# Patient Record
Sex: Female | Born: 1970
Health system: Southern US, Community
[De-identification: ages and names within clinical notes are randomized; demographics above are authoritative.]

## PROBLEM LIST (undated history)

## (undated) DIAGNOSIS — K649 Unspecified hemorrhoids: Secondary | ICD-10-CM

## (undated) HISTORY — PX: APPENDECTOMY: SHX54

## (undated) HISTORY — PX: TUBAL LIGATION: SHX77

---

## 1999-08-13 ENCOUNTER — Emergency Department (HOSPITAL_COMMUNITY): Admission: EM | Admit: 1999-08-13 | Discharge: 1999-08-13 | Payer: Self-pay | Admitting: Emergency Medicine

## 2005-06-21 ENCOUNTER — Emergency Department (HOSPITAL_COMMUNITY): Admission: EM | Admit: 2005-06-21 | Discharge: 2005-06-21 | Payer: Self-pay | Admitting: Emergency Medicine

## 2006-04-11 ENCOUNTER — Emergency Department (HOSPITAL_COMMUNITY): Admission: EM | Admit: 2006-04-11 | Discharge: 2006-04-11 | Payer: Self-pay | Admitting: Emergency Medicine

## 2012-02-09 ENCOUNTER — Encounter (HOSPITAL_COMMUNITY): Payer: Self-pay | Admitting: *Deleted

## 2012-02-09 ENCOUNTER — Emergency Department (HOSPITAL_COMMUNITY)
Admission: EM | Admit: 2012-02-09 | Discharge: 2012-02-09 | Disposition: A | Discharge status: Home / Self Care | Payer: Self-pay | Attending: Emergency Medicine | Admitting: Emergency Medicine

## 2012-02-09 DIAGNOSIS — B029 Zoster without complications: Secondary | ICD-10-CM | POA: Insufficient documentation

## 2012-02-09 MED ORDER — FLUORESCEIN SODIUM 1 MG OP STRP
1.0000 | ORAL_STRIP | Freq: Once | OPHTHALMIC | Status: AC
  Administered 2012-02-09: 1 via OPHTHALMIC

## 2012-02-09 MED ORDER — PREDNISONE 20 MG PO TABS: ORAL_TABLET | ORAL | Status: DC

## 2012-02-09 MED ORDER — ACYCLOVIR 800 MG PO TABS
800.0000 mg | ORAL_TABLET | Freq: Once | ORAL | Status: AC
  Administered 2012-02-09: 800 mg via ORAL
  Filled 2012-02-09: qty 1

## 2012-02-09 MED ORDER — ACYCLOVIR 800 MG PO TABS: 400.0000 mg | ORAL_TABLET | Freq: Every day | ORAL | Status: DC

## 2012-02-09 MED ORDER — PREDNISONE 10 MG PO TABS: ORAL_TABLET | ORAL | Status: DC

## 2012-02-09 MED ORDER — PREDNISONE 50 MG PO TABS
60.0000 mg | ORAL_TABLET | Freq: Once | ORAL | Status: AC
  Administered 2012-02-09: 60 mg via ORAL
  Filled 2012-02-09: qty 1

## 2012-02-09 MED ORDER — PREDNISONE 20 MG PO TABS
ORAL_TABLET | ORAL | Status: AC
  Filled 2012-02-09: qty 3

## 2012-02-09 NOTE — ED Notes (Signed)
Raised red rash to lt side of face. Present 4 days.  Extending into scalp

## 2012-02-09 NOTE — ED Provider Notes (Signed)
History   This chart was scribed for Ward Givens, MD scribed by Magnus Sinning. The patient was seen in room APA01/APA01 at 22:13    CSN: [id number]  Arrival date & time 02/09/12  2145   Chief Complaint  Patient presents with  . Rash    (Consider location/radiation/quality/duration/timing/severity/associated sxs/prior treatment) HPI Pamela Johnson is a 41 y.o. female who presents to the Emergency Department complaining of raised red rash located on the left side of face and scalp, onset three days. She says it began as a small area with one bump that she could feel. She says it is not painful or itchy. She adds that her left eye has been itching at the lateral corner, which she describes as itching similar to pink eye. She says she noticed she had redness to the left side of her nose yesterday and she says the redness has also extended to her scalp. She has normal vision and describes minimal feeling of wetness to the lateral aspect of her left eye.   Patient says she is otherwise healthy and does not take any daily medications. She does report her daughter had hx of chicken pox years ago and says during this time she did not experience any sxs. She is not sure if she had chicken pox or not.  Pt denies any extra stressors.   PCP: None  History reviewed. No pertinent past medical history.  Past Surgical History  Procedure Date  . Appendectomy   . Tubal ligation     History reviewed. No pertinent family history.  History  Substance Use Topics  . Smoking status: Never Smoker   . Smokeless tobacco: Not on file  . Alcohol Use: No  The patient does not drink or smoke.  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  Allergies  Review of patient's allergies indicates no known allergies.  Home Medications  No current outpatient prescriptions on file.  BP 130/65  Pulse 79  Temp 98 F  (36.7 C) (Oral)  Resp 18  Ht 5\' 4"  (1.626 m)  Wt 197 lb 11.2 oz (89.676 kg)  BMI 33.94 kg/m2  SpO2 100%  LMP 12/14/2011  Vital signs normal    Physical Exam  Nursing note and vitals reviewed. Constitutional: She is oriented to person, place, and time. She appears well-developed and well-nourished. No distress.  HENT:  Head: Normocephalic and atraumatic.    Eyes: Conjunctivae normal and EOM are normal. Pupils are equal, round, and reactive to light.  Neck: Neck supple. No tracheal deviation present.  Cardiovascular: Normal rate.   Pulmonary/Chest: Effort normal. No respiratory distress.  Abdominal: She exhibits no distension.  Musculoskeletal: Normal range of motion.  Lymphadenopathy:    She has no cervical adenopathy.  Neurological: She is alert and oriented to person, place, and time. No sensory deficit.  Skin: Skin is dry.       Between left eyebrow and ear she has mild diffuse redness and swelling and what appears to be early raised/blisters areas within that redness. Smaller area just lateral campus to her left eye. Three or four smaller areas on lateral left cheek. Swelling at where nasal ala joins cheeks. No lesions on the tip of her nose or forehead.   Psychiatric: She has a normal mood and affect. Her behavior is normal.    ED Course patient's rash has the appearance of  early herpes  zoster. There is no involvement of the tip of her nose which could be a concerning for involvement of the globe/corneal of the left eye. Her fluorescent stain uptake was also negative. She was given reasons to return to the EP. Patient also concerned about Thanksgiving dinner tomorrow she was also instructed on who to avoid until she's been on the medication a couple days.   Procedures (including critical care time)   Medications  acyclovir (ZOVIRAX) tablet 800 mg (not administered)  predniSONE (DELTASONE) tablet 60 mg (not administered)  fluorescein ophthalmic strip 1 strip (1 strip Left  Eye Given 02/09/12 2237)    DIAGNOSTIC STUDIES: Oxygen Saturation is 100% on room air, normal by my interpretation.    COORDINATION OF CARE:  Fluorescein uptake negative by blue light in left eye.   VA  OD 20/20, OS 20/25, OU 20/20 all normal   1. Herpes zoster    New Prescriptions   ACYCLOVIR (ZOVIRAX) 800 MG TABLET    Take 0.5 tablets (400 mg total) by mouth 5 (five) times daily.   PREDNISONE (DELTASONE) 20 MG TABLET    Take 3 po QD x 3d , then 2 po QD x 3d then 1 po QD x 3d    Plan discharge  Devoria Albe, MD, FACEP    MDM    I personally performed the services described in this documentation, which was scribed in my presence. The recorded information has been reviewed and considered.  Devoria Albe, MD, Armando Gang        Ward Givens, MD 02/09/12 [phone]

## 2013-11-26 ENCOUNTER — Encounter (HOSPITAL_COMMUNITY): Payer: Self-pay | Admitting: Emergency Medicine

## 2013-11-26 ENCOUNTER — Emergency Department (HOSPITAL_COMMUNITY)
Admission: EM | Admit: 2013-11-26 | Discharge: 2013-11-26 | Disposition: A | Discharge status: Home / Self Care | Payer: Self-pay | Attending: Emergency Medicine | Admitting: Emergency Medicine

## 2013-11-26 DIAGNOSIS — M25559 Pain in unspecified hip: Secondary | ICD-10-CM | POA: Insufficient documentation

## 2013-11-26 DIAGNOSIS — S76011A Strain of muscle, fascia and tendon of right hip, initial encounter: Secondary | ICD-10-CM

## 2013-11-26 DIAGNOSIS — X500XXA Overexertion from strenuous movement or load, initial encounter: Secondary | ICD-10-CM | POA: Insufficient documentation

## 2013-11-26 DIAGNOSIS — IMO0002 Reserved for concepts with insufficient information to code with codable children: Secondary | ICD-10-CM | POA: Insufficient documentation

## 2013-11-26 DIAGNOSIS — Y990 Civilian activity done for income or pay: Secondary | ICD-10-CM | POA: Insufficient documentation

## 2013-11-26 DIAGNOSIS — X503XXA Overexertion from repetitive movements, initial encounter: Secondary | ICD-10-CM | POA: Insufficient documentation

## 2013-11-26 DIAGNOSIS — Y9289 Other specified places as the place of occurrence of the external cause: Secondary | ICD-10-CM | POA: Insufficient documentation

## 2013-11-26 MED ORDER — DIAZEPAM 5 MG PO TABS
5.0000 mg | ORAL_TABLET | Freq: Once | ORAL | Status: AC
  Administered 2013-11-26: 5 mg via ORAL
  Filled 2013-11-26: qty 1

## 2013-11-26 MED ORDER — METHOCARBAMOL 500 MG PO TABS: 500.0000 mg | ORAL_TABLET | Freq: Three times a day (TID) | ORAL | Status: DC

## 2013-11-26 MED ORDER — HYDROCODONE-ACETAMINOPHEN 5-325 MG PO TABS: 1.0000 | ORAL_TABLET | ORAL | Status: DC | PRN

## 2013-11-26 MED ORDER — HYDROCODONE-ACETAMINOPHEN 5-325 MG PO TABS
2.0000 | ORAL_TABLET | Freq: Once | ORAL | Status: AC
  Administered 2013-11-26: 2 via ORAL
  Filled 2013-11-26: qty 2

## 2013-11-26 MED ORDER — IBUPROFEN 800 MG PO TABS
800.0000 mg | ORAL_TABLET | Freq: Once | ORAL | Status: AC
  Administered 2013-11-26: 800 mg via ORAL
  Filled 2013-11-26: qty 1

## 2013-11-26 MED ORDER — IBUPROFEN 800 MG PO TABS: 800.0000 mg | ORAL_TABLET | Freq: Three times a day (TID) | ORAL | Status: AC

## 2013-11-26 NOTE — Discharge Instructions (Signed)
Please soak in the right hip and warm Epsom salt water 2 times daily until this problem resolves. Please use Robaxin and ibuprofen 3 times daily with food. Please use Norco for pain if needed. Norco and Robaxin may cause drowsiness, please use with caution. Please see the orthopedic physician listed above for additional evaluation and management if not improving.

## 2013-11-26 NOTE — ED Notes (Signed)
Rt hip pain for 2 days, no injury.

## 2013-11-26 NOTE — ED Provider Notes (Signed)
CSN: [id number]     Arrival date & time 11/26/13  1311 History   First MD Initiated Contact with Patient 11/26/13 1549     Chief Complaint  Patient presents with  . Hip Pain     (Consider location/radiation/quality/duration/timing/severity/associated sxs/prior Treatment) Patient is a 43 y.o. female presenting with hip pain. The history is provided by the patient.  Hip Pain This is a new problem. The current episode started in the past 7 days. The problem occurs intermittently. The problem has been gradually worsening. Pertinent negatives include no abdominal pain, arthralgias, chest pain, chills, coughing, fever, neck pain or numbness. The symptoms are aggravated by standing and walking (movement). She has tried acetaminophen and NSAIDs for the symptoms. The treatment provided mild relief.    History reviewed. No pertinent past medical history. Past Surgical History  Procedure Laterality Date  . Appendectomy    . Tubal ligation     History reviewed. No pertinent family history. History  Substance Use Topics  . Smoking status: Never Smoker   . Smokeless tobacco: Not on file  . Alcohol Use: No   OB History   Grav Para Term Preterm Abortions TAB SAB Ect Mult Living                 Review of Systems  Constitutional: Negative for fever, chills and activity change.       All ROS Neg except as noted in HPI  Eyes: Negative for photophobia and discharge.  Respiratory: Negative for cough, shortness of breath and wheezing.   Cardiovascular: Negative for chest pain and palpitations.  Gastrointestinal: Negative for abdominal pain and blood in stool.  Genitourinary: Negative for dysuria, frequency and hematuria.  Musculoskeletal: Negative for arthralgias, back pain and neck pain.  Skin: Negative.   Neurological: Negative for dizziness, seizures, speech difficulty and numbness.  Psychiatric/Behavioral: Negative for hallucinations and confusion.      Allergies  Review of patient's  allergies indicates no known allergies.  Home Medications   Prior to Admission medications   Medication Sig Start Date End Date Taking? Authorizing Provider  acetaminophen (TYLENOL) 500 MG tablet Take 1,000 mg by mouth every 6 (six) hours as needed for mild pain.   Yes Historical Provider, MD  naproxen sodium (ANAPROX) 220 MG tablet Take 220 mg by mouth daily as needed (pain).   Yes Historical Provider, MD   BP 125/74  Pulse 78  Temp(Src) 98.8 F (37.1 C) (Oral)  Resp 18  Ht  (1.676 m)  Wt 190 lb (86.183 kg)  BMI 30.68 kg/m2  SpO2 100%  LMP 11/19/2013 Physical Exam  Nursing note and vitals reviewed. Constitutional: She is oriented to person, place, and time. She appears well-developed and well-nourished.  Non-toxic appearance.  HENT:  Head: Normocephalic.  Right Ear: Tympanic membrane and external ear normal.  Left Ear: Tympanic membrane and external ear normal.  Eyes: EOM and lids are normal. Pupils are equal, round, and reactive to light.  Neck: Normal range of motion. Neck supple. Carotid bruit is not present.  Cardiovascular: Normal rate, regular rhythm, normal heart sounds, intact distal pulses and normal pulses.   Pulmonary/Chest: Breath sounds normal. No respiratory distress.  Abdominal: Soft. Bowel sounds are normal. There is no tenderness. There is no guarding.  Musculoskeletal: Normal range of motion.       Right hip: She exhibits tenderness. She exhibits normal strength, no swelling and no deformity.       Legs: A there's no deformity noted of the  right hip. There is no hot joints appreciated. There is pain with palpation to the right hip. Minimal pain to attempted range of motion of the right hip. There is significant pain when the patient is ambulating applying weight. There is no shortening involving the right lower extremity. There is a negative Homans sign. Dorsalis pedis is 2+ bilaterally. There is full range of motion of the right knee, no effusion noted.    Lymphadenopathy:       Head (right side): No submandibular adenopathy present.       Head (left side): No submandibular adenopathy present.    She has no cervical adenopathy.  Neurological: She is alert and oriented to person, place, and time. She has normal strength. No cranial nerve deficit or sensory deficit.  Skin: Skin is warm and dry.  Psychiatric: She has a normal mood and affect. Her speech is normal.    ED Course  Procedures (including critical care time) Labs Review Labs Reviewed - No data to display  Imaging Review No results found.   EKG Interpretation None      MDM There's been no known trauma to the right hip or lower extremity. 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 The patient has not had any injections or procedures involving the right hip. His been no previous history of procedures or operations involving the right hip. Examination suggest the patient has a muscle strain involving the right hip. Patient will be placed on Norco, ibuprofen 800 mg, and Robaxin. Patient is to rest the hip area as much as possible.    Final diagnoses:  Hip strain, right, initial encounter    **I have reviewed nursing notes, vital signs, and all appropriate lab and imaging results for this patient.    Kathie Dike, PA-C 11/26/13 1902

## 2013-11-28 NOTE — ED Provider Notes (Signed)
Medical screening examination/treatment/procedure(s) were performed by non-physician practitioner and as supervising physician I was immediately available for consultation/collaboration.   EKG Interpretation None       Chloe Miyoshi M Bon Dowis, MD 11/28/13 2248 

## 2014-09-19 ENCOUNTER — Emergency Department (HOSPITAL_COMMUNITY)
Admission: EM | Admit: 2014-09-19 | Discharge: 2014-09-19 | Disposition: A | Discharge status: Home / Self Care | Payer: Self-pay | Attending: Emergency Medicine | Admitting: Emergency Medicine

## 2014-09-19 ENCOUNTER — Encounter (HOSPITAL_COMMUNITY): Payer: Self-pay

## 2014-09-19 DIAGNOSIS — K649 Unspecified hemorrhoids: Secondary | ICD-10-CM

## 2014-09-19 DIAGNOSIS — K644 Residual hemorrhoidal skin tags: Secondary | ICD-10-CM | POA: Insufficient documentation

## 2014-09-19 DIAGNOSIS — Z791 Long term (current) use of non-steroidal anti-inflammatories (NSAID): Secondary | ICD-10-CM | POA: Insufficient documentation

## 2014-09-19 DIAGNOSIS — Z79899 Other long term (current) drug therapy: Secondary | ICD-10-CM | POA: Insufficient documentation

## 2014-09-19 HISTORY — DX: Unspecified hemorrhoids: K64.9

## 2014-09-19 NOTE — Discharge Instructions (Signed)
Hemorrhoids °Hemorrhoids are swollen veins around the rectum or anus. There are two types of hemorrhoids:  °· Internal hemorrhoids. These occur in the veins just inside the rectum. They may poke through to the outside and become irritated and painful. °· External hemorrhoids. These occur in the veins outside the anus and can be felt as a painful swelling or hard lump near the anus. °CAUSES °· Pregnancy.   °· Obesity.   °· Constipation or diarrhea.   °· Straining to have a bowel movement.   °· Sitting for long periods on the toilet. °· Heavy lifting or other activity that caused you to strain. °· Anal intercourse. °SYMPTOMS  °· Pain.   °· Anal itching or irritation.   °· Rectal bleeding.   °· Fecal leakage.   °· Anal swelling.   °· One or more lumps around the anus.   °DIAGNOSIS  °Your caregiver may be able to diagnose hemorrhoids by visual examination. Other examinations or tests that may be performed include:  °· Examination of the rectal area with a gloved hand (digital rectal exam).   °· Examination of anal canal using a small tube (scope).   °· A blood test if you have lost a significant amount of blood. °· A test to look inside the colon (sigmoidoscopy or colonoscopy). °TREATMENT °Most hemorrhoids can be treated at home. However, if symptoms do not seem to be getting better or if you have a lot of rectal bleeding, your caregiver may perform a procedure to help make the hemorrhoids get smaller or remove them completely. Possible treatments include:  °· Placing a rubber band at the base of the hemorrhoid to cut off the circulation (rubber band ligation).   °· Injecting a chemical to shrink the hemorrhoid (sclerotherapy).   °· Using a tool to burn the hemorrhoid (infrared light therapy).   °· Surgically removing the hemorrhoid (hemorrhoidectomy).   °· Stapling the hemorrhoid to block blood flow to the tissue (hemorrhoid stapling).   °HOME CARE INSTRUCTIONS  °· Eat foods with fiber, such as whole grains, beans,  nuts, fruits, and vegetables. Ask your doctor about taking products with added fiber in them (fiber supplements). °· Increase fluid intake. Drink enough water and fluids to keep your urine clear or pale yellow.   °· Exercise regularly.   °· Go to the bathroom when you have the urge to have a bowel movement. Do not wait.   °· Avoid straining to have bowel movements.   °· Keep the anal area dry and clean. Use wet toilet paper or moist towelettes after a bowel movement.   °· Medicated creams and suppositories may be used or applied as directed.   °· Only take over-the-counter or prescription medicines as directed by your caregiver.   °· Take warm sitz baths for 15-20 minutes, 3-4 times a day to ease pain and discomfort.   °· Place ice packs on the hemorrhoids if they are tender and swollen. Using ice packs between sitz baths may be helpful.   °¨ Put ice in a plastic bag.   °¨ Place a towel between your skin and the bag.   °¨ Leave the ice on for 15-20 minutes, 3-4 times a day.   °· Do not use a donut-shaped pillow or sit on the toilet for long periods. This increases blood pooling and pain.   °SEEK MEDICAL CARE IF: °· You have increasing pain and swelling that is not controlled by treatment or medicine. °· You have uncontrolled bleeding. °· You have difficulty or you are unable to have a bowel movement. °· You have pain or inflammation outside the area of the hemorrhoids. °MAKE SURE YOU: °· Understand these instructions. °·   Will watch your condition.  Will get help right away if you are not doing well or get worse. Document Released: 02/27/2000 Document Revised: 02/16/2012 Document Reviewed: 01/04/2012 [facility] Patient Information 2015 [place], [place]. This information is not intended to replace advice given to you by your health care provider. Make sure you discuss any questions you have with your health care provider.  Please follow-up with attached specialist for further evaluation and management.

## 2014-09-19 NOTE — ED Notes (Signed)
Pt has an external hemorrhoid about the size of a blueberry that she noticed on the 1st and has tried several OTC and they haven't worked, hurts to sit, has tried to push back in and it won't stay.

## 2014-09-19 NOTE — ED Provider Notes (Signed)
CSN: [id number]     Arrival date & time 09/19/14  1311 History  This chart was scribed for non-physician practitioner [name], PA, working with Raeford Razor, MD, by Tanda Rockers, ED Scribe. This patient was seen in room TR01C/TR01C and the patient's care was started at 3:40 PM.   Chief Complaint  Patient presents with  . Hemorrhoids   The history is provided by the patient. No language interpreter was used.    HPI Comments: Pamela Johnson is a 44 y.o. female who presents to the Emergency Department complaining of external hemorrhoid x 1 week. She denies any change in size since onset. Pt has tried suppositories, wipes, soaking in baths, and cooling gel without relief. Denies bleeding from the hemorrhoid.  She mentions taking Miralax so she denies straining while having bowel movements. She has been taking fiber and drinking plenty of fluids. Pt has had internal hemorrhoids in the past but states this is much more severe.Denies fever, chills, nausea, vomiting, abdominal pain, or any other symptoms.   Past Medical History  Diagnosis Date  . Hemorrhoids    Past Surgical History  Procedure Laterality Date  . Appendectomy    . Tubal ligation     No family history on file. History  Substance Use Topics  . Smoking status: Never Smoker   . Smokeless tobacco: Not on file  . Alcohol Use: No   OB History    No data available     Review of Systems  All other systems reviewed and are negative.  Allergies  Review of patient's allergies indicates no known allergies.  Home Medications   Prior to Admission medications   Medication Sig Start Date End Date Taking? Authorizing Provider  acetaminophen (TYLENOL) 500 MG tablet Take 1,000 mg by mouth every 6 (six) hours as needed for mild pain.    Historical Provider, MD  HYDROcodone-acetaminophen (NORCO/VICODIN) 5-325 MG per tablet Take 1 tablet by mouth every 4 (four) hours as needed. 11/26/13   Ivery Quale, PA-C  ibuprofen  (ADVIL,MOTRIN) 800 MG tablet Take 1 tablet (800 mg total) by mouth 3 (three) times daily. 11/26/13   Ivery Quale, PA-C  methocarbamol (ROBAXIN) 500 MG tablet Take 1 tablet (500 mg total) by mouth 3 (three) times daily. 11/26/13   Ivery Quale, PA-C  naproxen sodium (ANAPROX) 220 MG tablet Take 220 mg by mouth daily as needed (pain).    Historical Provider, MD   Triage Vitals: BP 122/67 mmHg  Pulse 79  Temp(Src) 98.4 F (36.9 C) (Oral)  Resp 16  SpO2 98%  LMP 09/14/2014   Physical Exam  Constitutional: She is oriented to person, place, and time. She appears well-developed and well-nourished. No distress.  HENT:  Head: Normocephalic and atraumatic.  Eyes: Conjunctivae and EOM are normal.  Neck: Neck supple. No tracheal deviation present.  Cardiovascular: Normal rate, regular rhythm, normal heart sounds and intact distal pulses.  Exam reveals no gallop and no friction rub.   No murmur heard. Pulmonary/Chest: Effort normal and breath sounds normal. No respiratory distress. She has no wheezes. She has no rales. She exhibits no tenderness.  Genitourinary:  Female Chaperone Present: External hemorrhoid approximately 0.75 cm Tender to palpation No signs of bleeding  Musculoskeletal: Normal range of motion.  Neurological: She is alert and oriented to person, place, and time. She has normal strength. No cranial nerve deficit or sensory deficit. Coordination and gait normal. GCS eye subscore is 4. GCS verbal subscore is 5. GCS motor subscore is 6.  Skin: Skin is warm and dry.  Psychiatric: She has a normal mood and affect. Her behavior is normal.  Nursing note and vitals reviewed.   ED Course  Procedures (including critical care time)  DIAGNOSTIC STUDIES: Oxygen Saturation is 98% on RA, normal by my interpretation.    COORDINATION OF CARE: 3:48 PM-Discussed treatment plan which includes referral to surgeon with pt at bedside and pt agreed to plan.   Labs Review Labs Reviewed - No  data to display  Imaging Review No results found.   EKG Interpretation None      MDM   Final diagnoses:  Hemorrhoids, unspecified hemorrhoid type   Labs: None  Imaging: None  Consults: None  Therapeutics: None  Assessment:  Plan:  Patient presents with an external hemorrhoid. No bleeding. Patient able to use a restroom without difficulty. Patient given contact information for surgical evaluation. No need for surgical consult. Patient given strict return precautions, verbalized understanding and agreement for today's plan with no further questions or concerns at time of discharge         Eyvonne MechanicJeffrey Tomas Schamp, PA-C 09/19/14 1651  Raeford RazorStephen Kohut, MD 09/23/14 [phone]

## 2017-09-14 DIAGNOSIS — Z0000 Encounter for general adult medical examination without abnormal findings: Secondary | ICD-10-CM | POA: Diagnosis not present

## 2017-09-14 DIAGNOSIS — Z6831 Body mass index (BMI) 31.0-31.9, adult: Secondary | ICD-10-CM | POA: Diagnosis not present

## 2017-09-14 DIAGNOSIS — Z13220 Encounter for screening for lipoid disorders: Secondary | ICD-10-CM | POA: Diagnosis not present

## 2017-09-21 ENCOUNTER — Other Ambulatory Visit: Payer: Self-pay | Admitting: Family Medicine

## 2017-09-21 DIAGNOSIS — Z1231 Encounter for screening mammogram for malignant neoplasm of breast: Secondary | ICD-10-CM

## 2017-10-05 DIAGNOSIS — R102 Pelvic and perineal pain: Secondary | ICD-10-CM | POA: Diagnosis not present

## 2017-10-05 DIAGNOSIS — N8110 Cystocele, unspecified: Secondary | ICD-10-CM | POA: Diagnosis not present

## 2017-10-05 DIAGNOSIS — Z01411 Encounter for gynecological examination (general) (routine) with abnormal findings: Secondary | ICD-10-CM | POA: Diagnosis not present

## 2017-10-08 ENCOUNTER — Encounter (INDEPENDENT_AMBULATORY_CARE_PROVIDER_SITE_OTHER): Payer: Self-pay | Admitting: Podiatry

## 2017-10-08 ENCOUNTER — Ambulatory Visit: Payer: Self-pay

## 2017-10-08 DIAGNOSIS — M722 Plantar fascial fibromatosis: Secondary | ICD-10-CM

## 2017-10-08 NOTE — Progress Notes (Signed)
This encounter was created in error - please disregard.

## 2017-10-12 ENCOUNTER — Ambulatory Visit
Admission: RE | Admit: 2017-10-12 | Discharge: 2017-10-12 | Disposition: A | Discharge status: Home / Self Care | Payer: BLUE CROSS/BLUE SHIELD | Source: Ambulatory Visit | Attending: Family Medicine | Admitting: Family Medicine

## 2017-10-12 DIAGNOSIS — Z1231 Encounter for screening mammogram for malignant neoplasm of breast: Secondary | ICD-10-CM

## 2017-10-14 ENCOUNTER — Other Ambulatory Visit: Payer: Self-pay | Admitting: Family Medicine

## 2017-10-14 DIAGNOSIS — R928 Other abnormal and inconclusive findings on diagnostic imaging of breast: Secondary | ICD-10-CM

## 2017-10-19 ENCOUNTER — Ambulatory Visit
Admission: RE | Admit: 2017-10-19 | Discharge: 2017-10-19 | Disposition: A | Discharge status: Home / Self Care | Payer: BLUE CROSS/BLUE SHIELD | Source: Ambulatory Visit | Attending: Family Medicine | Admitting: Family Medicine

## 2017-10-19 DIAGNOSIS — R928 Other abnormal and inconclusive findings on diagnostic imaging of breast: Secondary | ICD-10-CM | POA: Diagnosis not present

## 2017-10-19 DIAGNOSIS — N6489 Other specified disorders of breast: Secondary | ICD-10-CM | POA: Diagnosis not present

## 2017-11-03 DIAGNOSIS — M545 Low back pain: Secondary | ICD-10-CM | POA: Diagnosis not present

## 2017-11-29 DIAGNOSIS — R102 Pelvic and perineal pain: Secondary | ICD-10-CM | POA: Diagnosis not present

## 2017-11-30 DIAGNOSIS — R102 Pelvic and perineal pain: Secondary | ICD-10-CM | POA: Diagnosis not present

## 2018-05-10 DIAGNOSIS — M25521 Pain in right elbow: Secondary | ICD-10-CM | POA: Diagnosis not present

## 2018-09-11 ENCOUNTER — Other Ambulatory Visit: Payer: Self-pay | Admitting: Family Medicine

## 2018-09-11 DIAGNOSIS — Z1231 Encounter for screening mammogram for malignant neoplasm of breast: Secondary | ICD-10-CM

## 2018-09-28 DIAGNOSIS — Z0000 Encounter for general adult medical examination without abnormal findings: Secondary | ICD-10-CM | POA: Diagnosis not present

## 2018-10-12 DIAGNOSIS — Z01411 Encounter for gynecological examination (general) (routine) with abnormal findings: Secondary | ICD-10-CM | POA: Diagnosis not present

## 2018-10-12 DIAGNOSIS — N841 Polyp of cervix uteri: Secondary | ICD-10-CM | POA: Diagnosis not present

## 2018-10-31 ENCOUNTER — Ambulatory Visit
Admission: RE | Admit: 2018-10-31 | Discharge: 2018-10-31 | Disposition: A | Discharge status: Home / Self Care | Payer: BC Managed Care – PPO | Source: Ambulatory Visit | Attending: Family Medicine | Admitting: Family Medicine

## 2018-10-31 ENCOUNTER — Other Ambulatory Visit: Payer: Self-pay

## 2018-10-31 DIAGNOSIS — Z1231 Encounter for screening mammogram for malignant neoplasm of breast: Secondary | ICD-10-CM | POA: Diagnosis not present

## 2018-11-01 ENCOUNTER — Other Ambulatory Visit: Payer: Self-pay | Admitting: Family Medicine

## 2018-11-01 DIAGNOSIS — R928 Other abnormal and inconclusive findings on diagnostic imaging of breast: Secondary | ICD-10-CM

## 2018-11-08 ENCOUNTER — Ambulatory Visit: Payer: BC Managed Care – PPO

## 2018-11-08 ENCOUNTER — Ambulatory Visit
Admission: RE | Admit: 2018-11-08 | Discharge: 2018-11-08 | Disposition: A | Discharge status: Home / Self Care | Payer: BC Managed Care – PPO | Source: Ambulatory Visit | Attending: Family Medicine | Admitting: Family Medicine

## 2018-11-08 ENCOUNTER — Other Ambulatory Visit: Payer: Self-pay

## 2018-11-08 DIAGNOSIS — R928 Other abnormal and inconclusive findings on diagnostic imaging of breast: Secondary | ICD-10-CM | POA: Diagnosis not present

## 2018-11-21 DIAGNOSIS — Z13220 Encounter for screening for lipoid disorders: Secondary | ICD-10-CM | POA: Diagnosis not present

## 2018-11-21 DIAGNOSIS — Z0000 Encounter for general adult medical examination without abnormal findings: Secondary | ICD-10-CM | POA: Diagnosis not present

## 2018-11-21 DIAGNOSIS — R000 Tachycardia, unspecified: Secondary | ICD-10-CM | POA: Diagnosis not present

## 2018-12-07 ENCOUNTER — Ambulatory Visit (INDEPENDENT_AMBULATORY_CARE_PROVIDER_SITE_OTHER): Payer: BC Managed Care – PPO | Admitting: Cardiovascular Disease

## 2018-12-07 ENCOUNTER — Other Ambulatory Visit: Payer: Self-pay

## 2018-12-07 ENCOUNTER — Encounter: Payer: Self-pay | Admitting: Cardiovascular Disease

## 2018-12-07 DIAGNOSIS — E782 Mixed hyperlipidemia: Secondary | ICD-10-CM

## 2018-12-07 DIAGNOSIS — R002 Palpitations: Secondary | ICD-10-CM | POA: Diagnosis not present

## 2018-12-07 DIAGNOSIS — E785 Hyperlipidemia, unspecified: Secondary | ICD-10-CM | POA: Insufficient documentation

## 2018-12-07 MED ORDER — ATORVASTATIN CALCIUM 20 MG PO TABS: 20.0000 mg | ORAL_TABLET | Freq: Every day | ORAL | 3 refills | Status: AC

## 2018-12-07 NOTE — Patient Instructions (Addendum)
Medication Instructions:  Your physician has recommended you make the following change in your medication:  START ATORVASTATIN (LIPITOR) 20 MG BY MOUTH DAILY  If you need a refill on your cardiac medications before your next appointment, please call your pharmacy.   Lab work: Multimedia programmer FASTING LIPID PANEL AND LIVER FUNCTION TEST LAB WORK TO OUR OFFICE AT [phone]  If you have labs (blood work) drawn today and your tests are completely normal, you will receive your results only by: Marland Kitchen MyChart Message (if you have MyChart) OR . A paper copy in the mail If you have any lab test that is abnormal or we need to change your treatment, we will call you to review the results.  Testing/Procedures: Your physician has recommended that you wear a 14 DAY ZIO-PATCH monitor. The Zio patch cardiac monitor continuously records heart rhythm data for up to 14 days, this is for patients being evaluated for multiple types heart rhythms. For the first 24 hours post application, please avoid getting the Zio monitor wet in the shower or by excessive sweating during exercise. After that, feel free to carry on with regular activities. Keep soaps and lotions away from the ZIO XT Patch.  This will be placed at our [facility] location - [address], [place].         Follow-Up: At [facility], you and your health needs are our priority.  As part of our continuing mission to provide you with exceptional heart care, we have created designated Provider Care Teams.  These Care Teams include your primary Cardiologist (physician) and Advanced Practice Providers (APPs -  Physician Assistants and Nurse Practitioners) who all work together to provide you with the care you need, when you need it. . You will need a follow up appointment AS NEEDED. You may see Dr. Gwenlyn Found or one of the following Advanced Practice Providers on your designated Care Team:   . Kerin Ransom, PA-C . Daleen Snook Kroeger, PA-C .  Sande Rives, PA-C ____________________ . Almyra Deforest, PA-C . Fabian Sharp, PA-C . Jory Sims, DNP . Rosaria Ferries, PA-C

## 2018-12-07 NOTE — Progress Notes (Signed)
12/07/2018 Pamela Johnson   Feb 12, 1971  [id number]  Primary Physician Darrow Bussing, MD Primary Cardiologist: Runell Gess MD Nicholes Calamity, [place]  HPI:  Pamela Johnson is a 48 y.o. moderately overweight married Caucasian female mother of 3 children, grandmother 3 grandchildren he works as a Lawyer at [employer].  She was referred by Dr.Koirala for cardiovascular valuation because of palpitations.  She really has no risk factors other than hyperlipidemia untreated.  There is no family history for heart disease.  She is never had heart attack or stroke.  She denies chest pain or shortness of breath.  She does walk a lot without symptoms.  2 weeks ago on a Sunday morning she had rapid onset of tachypalpitations which lasted all day and abruptly stopped that evening.  She has had nothing since.   Current Meds  Medication Sig  . acetaminophen (TYLENOL) 500 MG tablet Take 1,000 mg by mouth every 6 (six) hours as needed for mild pain.  Marland Kitchen ibuprofen (ADVIL,MOTRIN) 800 MG tablet Take 1 tablet (800 mg total) by mouth 3 (three) times daily.  . naproxen sodium (ANAPROX) 220 MG tablet Take 220 mg by mouth daily as needed (pain).  . [DISCONTINUED] HYDROcodone-acetaminophen (NORCO/VICODIN) 5-325 MG per tablet Take 1 tablet by mouth every 4 (four) hours as needed.  . [DISCONTINUED] methocarbamol (ROBAXIN) 500 MG tablet Take 1 tablet (500 mg total) by mouth 3 (three) times daily.     No Known Allergies  Social History   Socioeconomic History  . Marital status: Married    Spouse name: Not on file  . Number of children: Not on file  . Years of education: Not on file  . Highest education level: Not on file  Occupational History  . Not on file  Social Needs  . Financial resource strain: Not on file  . Food insecurity    Worry: Not on file    Inability: Not on file  . Transportation needs    Medical: Not on file    Non-medical: Not on file  Tobacco Use  . Smoking status:  Never Smoker  . Smokeless tobacco: Never Used  Substance and Sexual Activity  . Alcohol use: No  . Drug use: No  . Sexual activity: Yes    Birth control/protection: Surgical  Lifestyle  . Physical activity    Days per week: Not on file    Minutes per session: Not on file  . Stress: Not on file  Relationships  . Social Musician on phone: Not on file    Gets together: Not on file    Attends religious service: Not on file    Active member of club or organization: Not on file    Attends meetings of clubs or organizations: Not on file    Relationship status: Not on file  . Intimate partner violence    Fear of current or ex partner: Not on file    Emotionally abused: Not on file    Physically abused: Not on file    Forced sexual activity: Not on file  Other Topics Concern  . Not on file  Social History Narrative  . Not on file     Review of Systems: General: negative for chills, fever, night sweats or weight changes.  Cardiovascular: negative for chest pain, dyspnea on exertion, edema, orthopnea, palpitations, paroxysmal nocturnal dyspnea or shortness of breath Dermatological: negative for rash Respiratory: negative for cough or wheezing Urologic: negative for hematuria  Abdominal: negative for nausea, vomiting, diarrhea, bright red blood per rectum, melena, or hematemesis Neurologic: negative for visual changes, syncope, or dizziness All other systems reviewed and are otherwise negative except as noted above.    Blood pressure 135/87, pulse 88, height 5\' 5"  (1.651 m), weight 179 lb (81.2 kg), SpO2 100 %.  General appearance: alert and no distress Neck: no adenopathy, no carotid bruit, no JVD, supple, symmetrical, trachea midline and thyroid not enlarged, symmetric, no tenderness/mass/nodules Lungs: clear to auscultation bilaterally Heart: regular rate and rhythm, S1, S2 normal, no murmur, click, rub or gallop Extremities: extremities normal, atraumatic, no  cyanosis or edema Pulses: 2+ and symmetric Skin: Skin color, texture, turgor normal. No rashes or lesions Neurologic: Alert and oriented X 3, normal strength and tone. Normal symmetric reflexes. Normal coordination and gait  EKG sinus rhythm 88 with poor R wave progression.  I personally reviewed this EKG.  ASSESSMENT AND PLAN:   Hyperlipidemia History of hyperlipidemia with lipid profile performed 11/19/2018 revealing total cholesterol 234, LDL 141 and HDL 63.  And I start her on atorvastatin 20 mg a day.  She is scheduled to have blood work performed in early November which she can fax to [place]  Palpitations Ms. Masonneuve was referred by Dr. Dorthy Cooler for evaluation of palpitations.  She had an episode approximately 2 weeks ago on a Sunday morning that started fairly abruptly and lasted approxi-10 hours and ended fairly abruptly that evening.  She had no inciting factors.  Her TSH was normal.  She did not use any stimulants.  She drinks 2 glasses of alcohol on occasion.  She is never had episodes prior to that or subsequent to that.  I am going get a 2-week ZIO patch to to assess if there are any other arrhythmias that may be contributory.      Lorretta Harp MD FACP,FACC,FAHA, [facility] 12/07/2018 2:06 PM

## 2018-12-07 NOTE — Assessment & Plan Note (Signed)
Ms. Pamela Johnson was referred by Dr. Dorthy Cooler for evaluation of palpitations.  She had an episode approximately 2 weeks ago on a Sunday morning that started fairly abruptly and lasted approxi-10 hours and ended fairly abruptly that evening.  She had no inciting factors.  Her TSH was normal.  She did not use any stimulants.  She drinks 2 glasses of alcohol on occasion.  She is never had episodes prior to that or subsequent to that.  I am going get a 2-week ZIO patch to to assess if there are any other arrhythmias that may be contributory.

## 2018-12-07 NOTE — Assessment & Plan Note (Signed)
History of hyperlipidemia with lipid profile performed 11/19/2018 revealing total cholesterol 234, LDL 141 and HDL 63.  And I start her on atorvastatin 20 mg a day.  She is scheduled to have blood work performed in early November which she can fax to [place]

## 2018-12-11 ENCOUNTER — Telehealth: Payer: Self-pay | Admitting: *Deleted

## 2018-12-11 NOTE — Telephone Encounter (Signed)
Please call [phone] to set up ZIO patch heart monitor requested by Dr. Gwenlyn Found.

## 2018-12-11 NOTE — Telephone Encounter (Signed)
14 day ZIO XT long term holter monitor to be mailed to the patients home. Instructions reviewed briefly as they are included in the monitor kit. 

## 2018-12-15 ENCOUNTER — Ambulatory Visit (INDEPENDENT_AMBULATORY_CARE_PROVIDER_SITE_OTHER): Payer: BC Managed Care – PPO

## 2018-12-15 DIAGNOSIS — R002 Palpitations: Secondary | ICD-10-CM

## 2019-01-09 DIAGNOSIS — R002 Palpitations: Secondary | ICD-10-CM | POA: Diagnosis not present

## 2019-01-24 DIAGNOSIS — M5432 Sciatica, left side: Secondary | ICD-10-CM | POA: Diagnosis not present

## 2019-01-24 DIAGNOSIS — J029 Acute pharyngitis, unspecified: Secondary | ICD-10-CM | POA: Diagnosis not present

## 2019-01-31 ENCOUNTER — Ambulatory Visit: Payer: BC Managed Care – PPO | Admitting: Physical Therapy

## 2019-02-01 ENCOUNTER — Encounter: Payer: Self-pay | Admitting: Physical Therapy

## 2019-02-01 ENCOUNTER — Other Ambulatory Visit: Payer: Self-pay

## 2019-02-01 ENCOUNTER — Ambulatory Visit: Payer: BC Managed Care – PPO | Attending: Family Medicine | Admitting: Physical Therapy

## 2019-02-01 DIAGNOSIS — M6281 Muscle weakness (generalized): Secondary | ICD-10-CM | POA: Diagnosis not present

## 2019-02-01 DIAGNOSIS — R262 Difficulty in walking, not elsewhere classified: Secondary | ICD-10-CM | POA: Diagnosis not present

## 2019-02-01 DIAGNOSIS — M5442 Lumbago with sciatica, left side: Secondary | ICD-10-CM | POA: Diagnosis not present

## 2019-02-01 NOTE — Patient Instructions (Signed)
Access Code: [facility]  URL: [email or web address]  Date: 02/01/2019  Prepared by: Ruben Im   Exercises  Prone Press Up - 10 reps - 1 sets - 7x daily - 7x weekly  Standing Lumbar Extension - 10 reps - 1 sets - 2x daily - 7x weekly  Seated Correct Posture - 1 reps - 1 sets - 1x daily - 7x weekly

## 2019-02-01 NOTE — Therapy (Signed)
[facility] [address] [place], [place], [address] Phone: [phone]   Fax:  [phone]  Physical Therapy Evaluation  Patient Details  Name: Pamela Johnson MRN: [id number] Date of Birth: 04-01-1970 Referring Provider (PT): Dr. Darrow Bussing   Encounter Date: 02/01/2019  PT End of Session - 02/01/19 1029    Visit Number  1    Date for PT Re-Evaluation  03/29/19    Authorization Type  BCBS    PT Start Time  [id number]    PT Stop Time  1020    PT Time Calculation (min)  44 min    Activity Tolerance  Patient tolerated treatment well       Past Medical History:  Diagnosis Date  . Hemorrhoids     Past Surgical History:  Procedure Laterality Date  . APPENDECTOMY    . TUBAL LIGATION      There were no vitals filed for this visit.   Subjective Assessment - 02/01/19 0940    Subjective  left buttock pain and radiating pain to ankle starting 1 month ago.  No recollection of mechanism of injury.    Pertinent History  no previous history of LBP    Limitations  House hold activities;Walking;Sitting    How long can you sit comfortably?  1 hour    How long can you walk comfortably?  1 block    Diagnostic tests  none;  MRI if after PT    Patient Stated Goals  be able to do something without worrying about pain, get comfortable    Currently in Pain?  Yes    Pain Score  5     Pain Location  Back    Pain Orientation  Left    Pain Descriptors / Indicators  Aching    Pain Type  Acute pain    Aggravating Factors   sitting, night time/lying down    Pain Relieving Factors  standing; sometimes moving left leg         [facility] PT Assessment - 02/01/19 0001      Assessment   Medical Diagnosis  left sciatica     Referring Provider (PT)  Dr. Carilyn Goodpasture Koirala    Onset Date/Surgical Date  --   1 month   Next MD Visit  as needed     Prior Therapy  none      Precautions   Precautions  None      Restrictions   Weight  Bearing Restrictions  No      Balance Screen   Has the patient fallen in the past 6 months  No    Has the patient had a decrease in activity level because of a fear of falling?   No    Is the patient reluctant to leave their home because of a fear of falling?   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    Leisure  baking       Observation/Other Assessments   Focus on Therapeutic Outcomes (FOTO)  44% limitation       Posture/Postural Control   Posture/Postural Control  Postural limitations    Postural Limitations  Decreased lumbar lordosis      AROM   Overall AROM Comments  possible extension preference with repeated movement testing     Lumbar Flexion  60   painful   Lumbar Extension  20    Lumbar - Right Side Bend  40    Lumbar - Left Side Bend  42      Strength   Right Hip ABduction  5/5    Left Hip ABduction  4-/5    Lumbar Flexion  4-/5    Lumbar Extension  4-/5      Flexibility   Soft Tissue Assessment /Muscle Length  yes    Hamstrings  90 degrees bil       Special Tests   Other special tests  no pain with cough/sneeze; no bowel/bladder disturb      Prone Knee Bend Test   Findings  Negative      Straight Leg Raise   Findings  Negative      Ambulation/Gait   Gait Comments  antalgic gait                 Objective measurements completed on examination: See above findings.              PT Education - 02/01/19 1026    Education Details  Access Code: [email or web address]   trial of extensions prone and standing; limit sitting, use lumbar roll; short walks    Person(s) Educated  Patient    Methods   Explanation;Demonstration;Handout    Comprehension  Returned demonstration;Verbalized understanding       PT Short Term Goals - 02/01/19 1042      PT SHORT TERM GOAL #1   Title  The patient express understanding of basic self care and centralization principles    Time  4    Period  Weeks    Status  New    Target Date  03/01/19      PT SHORT TERM GOAL #2   Title  The patient will report a 30% improvement in left lumbosacral and left LE pain at night time for improved sleep    Time  4    Period  Weeks    Status  New      PT SHORT TERM GOAL #3   Title  The patient will be able to walk 15-20 minutes with pain level 3/10    Time  4    Period  Weeks    Status  New      PT SHORT TERM GOAL #4   Title  Lumbar flexion improved to 65 degrees and extension to 25 degrees 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    Time  4    Period  Weeks    Status  New        PT Long Term Goals - 02/01/19 1046      PT LONG TERM GOAL #1   Title  The patient will be independent in safe self progression of HEP    Time  8    Period  Weeks    Status  New    Target Date  03/29/19      PT LONG TERM GOAL #2   Title  The patient will report a 60% improvement in sleep    Time  8    Period  Weeks  Status  New      PT LONG TERM GOAL #3   Title  The patient will be able to walk 30 minutes with pain level 2/10 or less    Time  8    Period  Weeks    Status  New      PT LONG TERM GOAL #4   Title  Trunk and left hip strength improved to 4 to 4+/5 needed for work duties as a LawyerCNA and stair climbing with greater ease    Time  8    Period  Weeks    Status  New      PT LONG TERM GOAL #5   Title  FOTO functional outcome score improved from 44% limitation to 28% limitation    Time  8    Period  Weeks    Status  New             Plan - 02/01/19 1030    Clinical Impression Statement  The patient reports a 1 month history of left lumbo-sacral region pain radiating to her left groin, thigh  and extending down to the right ankle.  No known cause of onset.  She complains of difficulty sleeping at night, painful sitting and slowness with rising,  She has a 30 minute commute to work.   She reports increased pain with walking and is limited to a block or so.  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  She helps her grandson with remote learning 2 days/week.  Minimal movement loss with lumbar ROM.  Possible lumbar extension directional preference although symptoms not fully centralized.  Decreased lumbar lordosis.  Tender points in left SI joint, gluteals.  Negative neural signs.  Decreased trunk flexion, extension strength and decreased left hip abduction strength 4-/5.  Antalgic gait pattern.  She would benefit from PT to address these deficits.    Personal Factors and Comorbidities  Profession    Examination-Activity Limitations  Sleep;Caring for Others;Lift;Locomotion Level;Bed Mobility;Bend;Sit    Examination-Participation Restrictions  Cleaning;Community Activity;Driving;Other    Stability/Clinical Decision Making  Stable/Uncomplicated    Clinical Decision Making  Low    Rehab Potential  Good    PT Frequency  2x / week    PT Duration  8 weeks    PT Treatment/Interventions  ADLs/Self Care Home Management;Cryotherapy;Electrical Stimulation;Ultrasound;Traction;Moist Heat;Iontophoresis 4mg /ml Dexamethasone;Stair training;Therapeutic activities;Therapeutic exercise;Neuromuscular re-education;Manual techniques;Patient/family education;Dry needling;Taping;Spinal Manipulations    PT Next Visit Plan  assess response to lumbar extension trial and progress;  initiated transverse abdominus muscle activation;  ES/heat modalities as needed    PT Home Exercise Plan  Access Code: [email or web address]    Consulted and Agree with Plan of Care  Patient       Patient will benefit from skilled therapeutic intervention in order to improve the following deficits and  impairments:  Decreased range of motion, Difficulty walking, Decreased activity tolerance, Pain, Decreased strength, Postural dysfunction  Visit Diagnosis: Acute left-sided low back pain with left-sided sciatica - Plan: PT plan of care cert/re-cert  Muscle weakness (generalized) - Plan: PT plan of care cert/re-cert  Difficulty in walking, not elsewhere classified - Plan: PT plan of care cert/re-cert     Problem List Patient Active Problem List   Diagnosis Date Noted  . Hyperlipidemia 12/07/2018  . Palpitations 12/07/2018   Pamela Johnson, PT 02/01/19 10:51 AM Phone: [phone] Fax: [phone] Pamela Johnson, Pamela Johnson 02/01/2019, 10:51 AM  [facility] [address] [place], [place], [address] Phone: [phone]   Fax:  [phone]  Name: Pamela Johnson MRN: [id number] Date of Birth: January 18, 1971

## 2019-02-13 ENCOUNTER — Ambulatory Visit: Payer: BC Managed Care – PPO | Attending: Family Medicine

## 2019-02-13 ENCOUNTER — Other Ambulatory Visit: Payer: Self-pay

## 2019-02-13 DIAGNOSIS — M5442 Lumbago with sciatica, left side: Secondary | ICD-10-CM | POA: Diagnosis not present

## 2019-02-13 DIAGNOSIS — M6281 Muscle weakness (generalized): Secondary | ICD-10-CM

## 2019-02-13 DIAGNOSIS — R262 Difficulty in walking, not elsewhere classified: Secondary | ICD-10-CM | POA: Diagnosis not present

## 2019-02-13 NOTE — Patient Instructions (Signed)

## 2019-02-13 NOTE — Therapy (Signed)
[facility] [address] [place], [place], [address] Phone: [phone]   Fax:  [phone]  Physical Therapy Treatment  Patient Details  Name: Pamela Johnson MRN: [id number] Date of Birth: 21-Jul-1970 Referring Provider (PT): Dr. Lujean Amel   Encounter Date: 02/13/2019  PT End of Session - 02/13/19 1103    Visit Number  2    Date for PT Re-Evaluation  03/29/19    Authorization Type  BCBS    PT Start Time  1016   dry needling   PT Stop Time  1110    PT Time Calculation (min)  54 min    Activity Tolerance  Patient tolerated treatment well    Behavior During Therapy  [facility] for tasks assessed/performed       Past Medical History:  Diagnosis Date  . Hemorrhoids     Past Surgical History:  Procedure Laterality Date  . APPENDECTOMY    . TUBAL LIGATION      There were no vitals filed for this visit.  Subjective Assessment - 02/13/19 1018    Subjective  I don't know how much more that I can take.  I dont feel any difference in my leg pain.    Patient Stated Goals  be able to do something without worrying about pain, get comfortable    Currently in Pain?  Yes    Pain Score  5     Pain Location  Back    Pain Orientation  Left    Pain Descriptors / Indicators  Aching;Numbness    Pain Type  Acute pain    Pain Onset  More than a month ago    Pain Frequency  Constant    Aggravating Factors   sitting, night time/lying down    Pain Relieving Factors  standing, nothing really works                       [employer] Adult PT Treatment/Exercise - 02/13/19 0001      Modalities   Modalities  Electrical Stimulation;Moist Heat      Moist Heat Therapy   Number Minutes Moist Heat  15 Minutes    Moist Heat Location  Lumbar Spine;Hip      Electrical Stimulation   Electrical Stimulation Location  Lt lumbar spine and posterior Lt leg    Electrical Stimulation Action  IFC    Electrical Stimulation  Parameters  15 minutes    Electrical Stimulation Goals  Pain      Manual Therapy   Manual Therapy  Soft tissue mobilization;Myofascial release    Manual therapy comments  soft tissue elongation to Lt gluteals and bil lumbar spine       Trigger Point Dry Needling - 02/13/19 0001    Consent Given?  Yes    Education Handout Provided  Yes    Muscles Treated Back/Hip  Gluteus minimus;Gluteus medius;Piriformis;Lumbar multifidi    Dry Needling Comments  gluteals-Lt only    Gluteus Minimus Response  Twitch response elicited;Palpable increased muscle length    Gluteus Medius Response  Twitch response elicited;Palpable increased muscle length    Piriformis Response  Twitch response elicited;Palpable increased muscle length    Lumbar multifidi Response  Twitch response elicited;Palpable increased muscle length           PT Education - 02/13/19 1021    Education Details  DN info and home TENs    Person(s) Educated  Patient    Methods  Explanation;Handout    Comprehension  Verbalized understanding       PT Short Term Goals - 02/01/19 1042      PT SHORT TERM GOAL #1   Title  The patient express understanding of basic self care and centralization principles    Time  4    Period  Weeks    Status  New    Target Date  03/01/19      PT SHORT TERM GOAL #2   Title  The patient will report a 30% improvement in left lumbosacral and left LE pain at night time for improved sleep    Time  4    Period  Weeks    Status  New      PT SHORT TERM GOAL #3   Title  The patient will be able to walk 15-20 minutes with pain level 3/10    Time  4    Period  Weeks    Status  New      PT SHORT TERM GOAL #4   Title  Lumbar flexion improved to 65 degrees and extension to 25 degrees 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    Time  4    Period  Weeks    Status  New        PT Long Term Goals - 02/01/19 1046      PT LONG TERM GOAL #1   Title  The patient will be independent in safe self  progression of HEP    Time  8    Period  Weeks    Status  New    Target Date  03/29/19      PT LONG TERM GOAL #2   Title  The patient will report a 60% improvement in sleep    Time  8    Period  Weeks    Status  New      PT LONG TERM GOAL #3   Title  The patient will be able to walk 30 minutes with pain level 2/10 or less    Time  8    Period  Weeks    Status  New      PT LONG TERM GOAL #4   Title  Trunk and left hip strength improved to 4 to 4+/5 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  Pt denies any change in symptoms with extension based exercise.  Pt rates pain as 6/10 that is constant into the Lt LE.  Pt is not able to find a comfortable position with sleep or daily activities.  Session today focused on dry needling to the lumbar spine and Lt gluteals.  Pt with tension in Lt>Rt paraspinals/multifidi and Lt gluteals.  Pt reported reduced pain after manual therapy and electrical stimulation. Pt received information on home TENs to address pain.  PT advised pt to continue with extension based exercise.  Pt will benefit from skilled PT  to address Lt LE radiculopathy.    Rehab Potential  Good    PT Frequency  2x / week    PT Duration  8 weeks    PT Treatment/Interventions  ADLs/Self Care Home Management;Cryotherapy;Electrical Stimulation;Ultrasound;Traction;Moist Heat;Iontophoresis 4mg /ml Dexamethasone;Stair training;Therapeutic activities;Therapeutic exercise;Neuromuscular re-education;Manual techniques;Patient/family education;Dry needling;Taping;Spinal Manipulations    PT Next Visit Plan  assess response to dry needling and electrical  stimulation.  Try lumbar traction.  TA activation.    PT Home Exercise Plan  Access Code: [facility]    Recommended Other Services  initial cert is signed    Consulted and Agree with Plan of Care  Patient       Patient will benefit from skilled therapeutic intervention in order to improve the following deficits and impairments:  Decreased range of motion, Difficulty walking, Decreased activity tolerance, Pain, Decreased strength, Postural dysfunction  Visit Diagnosis: Acute left-sided low back pain with left-sided sciatica  Muscle weakness (generalized)  Difficulty in walking, not elsewhere classified     Problem List Patient Active Problem List   Diagnosis Date Noted  . Hyperlipidemia 12/07/2018  . Palpitations 12/07/2018    12/09/2018, PT 02/13/19 11:04 AM  [facility] [address] [place], [place], [place] Phone: [phone]   Fax:  [phone]  Name: Pamela Johnson MRN: Wallace Cullens Date of Birth: [birth year]

## 2019-02-15 ENCOUNTER — Encounter

## 2019-02-20 ENCOUNTER — Ambulatory Visit: Payer: BC Managed Care – PPO

## 2019-02-20 ENCOUNTER — Other Ambulatory Visit: Payer: Self-pay

## 2019-02-20 DIAGNOSIS — M6281 Muscle weakness (generalized): Secondary | ICD-10-CM

## 2019-02-20 DIAGNOSIS — M5442 Lumbago with sciatica, left side: Secondary | ICD-10-CM | POA: Diagnosis not present

## 2019-02-20 DIAGNOSIS — R262 Difficulty in walking, not elsewhere classified: Secondary | ICD-10-CM

## 2019-02-20 NOTE — Therapy (Signed)
[facility] [address] [place], [place], [address] Phone: [phone]   Fax:  [phone]  Physical Therapy Treatment  Patient Details  Name: Pamela Johnson MRN: [id number] Date of Birth: October 28, 1970 Referring Provider (PT): Dr. Lujean Amel   Encounter Date: 02/20/2019  PT End of Session - 02/20/19 1042    Visit Number  3    Date for PT Re-Evaluation  03/29/19    Authorization Type  BCBS    PT Start Time  1021    PT Stop Time  1105    PT Time Calculation (min)  44 min    Activity Tolerance  Patient tolerated treatment well    Behavior During Therapy  [facility] for tasks assessed/performed       Past Medical History:  Diagnosis Date  . Hemorrhoids     Past Surgical History:  Procedure Laterality Date  . APPENDECTOMY    . TUBAL LIGATION      There were no vitals filed for this visit.  Subjective Assessment - 02/20/19 1023    Subjective  Not much change in symptoms.    Patient Stated Goals  be able to do something without worrying about pain, get comfortable    Currently in Pain?  Yes    Pain Score  5     Pain Location  Back    Pain Orientation  Left    Pain Descriptors / Indicators  Aching    Pain Type  Acute pain    Pain Onset  More than a month ago    Pain Frequency  Constant    Aggravating Factors   sitting on the floor, night time/ lying down    Pain Relieving Factors  standing, nothing really works                       [employer] Adult PT Treatment/Exercise - 02/20/19 0001      Modalities   Modalities  Traction      Moist Heat Therapy   Number Minutes Moist Heat  15 Minutes    Moist Heat Location  Lumbar Spine;Hip      Electrical Stimulation   Electrical Stimulation Location  Lt lumbar spine and posterior Lt leg    Electrical Stimulation Action  IFC    Electrical Stimulation Parameters  15 minutes    Electrical Stimulation Goals  Pain      Traction   Type of Traction   Lumbar    Min (lbs)  50    Max (lbs)  75    Hold Time  60    Rest Time  10    Time  15               PT Short Term Goals - 02/01/19 1042      PT SHORT TERM GOAL #1   Title  The patient express understanding of basic self care and centralization principles    Time  4    Period  Weeks    Status  New    Target Date  03/01/19      PT SHORT TERM GOAL #2   Title  The patient will report a 30% improvement in left lumbosacral and left LE pain at night time for improved sleep    Time  4    Period  Weeks    Status  New      PT SHORT TERM GOAL #3   Title  The patient  will be able to walk 15-20 minutes with pain level 3/10    Time  4    Period  Weeks    Status  New      PT SHORT TERM GOAL #4   Title  Lumbar flexion improved to 65 degrees and 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    Time  4    Period  Weeks    Status  New        PT Long Term Goals - 02/01/19 1046      PT LONG TERM GOAL #1   Title  The patient will be independent in safe self progression of HEP    Time  8    Period  Weeks    Status  New    Target Date  03/29/19      PT LONG TERM GOAL #2   Title  The patient will report a 60% improvement in sleep    Time  8    Period  Weeks    Status  New      PT LONG TERM GOAL #3   Title  The patient will be able to walk 30 minutes with pain level 2/10 or less    Time  8    Period  Weeks    Status  New      PT LONG TERM GOAL #4   Title  Trunk and left hip strength improved to 4 to 4+/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  Pt continues to perform extension  based exercise at home and has not had any symptom relief.  Session today focused on lumbar traction and electrical stimulation to address Lt LE radiculopathy.  Pt was tense throughout traction treatment and PT provided verbal cues to relax.  Pt will continue to benefit from skilled PT to address Lt LE radiculopathy.    PT Treatment/Interventions  ADLs/Self Care Home Management;Cryotherapy;Electrical Stimulation;Ultrasound;Traction;Moist Heat;Iontophoresis 4mg /ml Dexamethasone;Stair training;Therapeutic activities;Therapeutic exercise;Neuromuscular re-education;Manual techniques;Patient/family education;Dry needling;Taping;Spinal Manipulations    PT Next Visit Plan  continue traction and electrical stimulation if helpful.  TA activation.  Nerve flossing on Lt    PT Home Exercise Plan  Access Code: [email or web address]    Consulted and Agree with Plan of Care  Patient       Patient will benefit from skilled therapeutic intervention in order to improve the following deficits and impairments:  Decreased range of motion, Difficulty walking, Decreased activity tolerance, Pain, Decreased strength, Postural dysfunction  Visit Diagnosis: Muscle weakness (generalized)  Acute left-sided low back pain with left-sided sciatica  Difficulty in walking, not elsewhere classified     Problem List Patient Active Problem List   Diagnosis Date Noted  . Hyperlipidemia 12/07/2018  . Palpitations 12/07/2018    12/09/2018, PT 02/20/19 10:43 AM  [facility] [address] [place], [place],  [id number] Phone: [phone]   Fax:  [phone]  Name: Wallace Cullenseresa L Allbritton MRN: [id number] Date of Birth: 09/30/1970

## 2019-02-22 ENCOUNTER — Encounter: Payer: Self-pay | Admitting: Physical Therapy

## 2019-02-22 ENCOUNTER — Ambulatory Visit: Payer: BC Managed Care – PPO | Admitting: Physical Therapy

## 2019-02-22 ENCOUNTER — Other Ambulatory Visit: Payer: Self-pay

## 2019-02-22 DIAGNOSIS — M5442 Lumbago with sciatica, left side: Secondary | ICD-10-CM | POA: Diagnosis not present

## 2019-02-22 DIAGNOSIS — M6281 Muscle weakness (generalized): Secondary | ICD-10-CM

## 2019-02-22 DIAGNOSIS — R262 Difficulty in walking, not elsewhere classified: Secondary | ICD-10-CM | POA: Diagnosis not present

## 2019-02-22 NOTE — Therapy (Signed)
[facility] [address] [place], [place], [address] Phone: [phone]   Fax:  [phone]  Physical Therapy Treatment  Patient Details  Name: Pamela Johnson MRN: [id number] Date of Birth: 11-10-1970 Referring Provider (PT): Dr. Lujean Amel   Encounter Date: 02/22/2019  PT End of Session - 02/22/19 1940    Visit Number  4    Date for PT Re-Evaluation  03/29/19    Authorization Type  BCBS    PT Start Time  1027   pt late   PT Stop Time  1114    PT Time Calculation (min)  47 min    Activity Tolerance  Patient tolerated treatment well       Past Medical History:  Diagnosis Date  . Hemorrhoids     Past Surgical History:  Procedure Laterality Date  . APPENDECTOMY    . TUBAL LIGATION      There were no vitals filed for this visit.  Subjective Assessment - 02/22/19 1027    Subjective  I'm OK.  The back and leg is the same.  No worse.  The traction hurt my back the next day and while it was on.  Still get leg pain, maybe less often.    Currently in Pain?  Yes    Pain Score  5     Pain Location  Back    Pain Orientation  Left    Pain Type  Acute pain                       [facility] Adult PT Treatment/Exercise - 02/22/19 0001      Self-Care   Self-Care  Other Self-Care Comments    Other Self-Care Comments   discussion on importance of sleep to promote healing;  discussed relaxation, meditation to calm nervous system       Moist Heat Therapy   Number Minutes Moist Heat  15 Minutes    Moist Heat Location  Lumbar Spine;Hip      Electrical Stimulation   Electrical Stimulation Location  Lt lumbar spine and posterior Lt leg    Electrical Stimulation Action  IFC prone over 1 pillow     Electrical Stimulation Parameters  12    Electrical Stimulation Goals  Pain      Traction   Type of Traction  Lumbar   6 steps for gradual pull    Min (lbs)  30    Max (lbs)  60    Hold Time  60    Rest Time  10    Time  12               PT Short Term Goals - 02/01/19 1042      PT SHORT TERM GOAL #1   Title  The patient express understanding of basic self care and centralization principles    Time  4    Period  Weeks    Status  New    Target Date  03/01/19      PT SHORT TERM GOAL #2   Title  The patient will report a 30% improvement in left lumbosacral and left LE pain at night time for improved sleep    Time  4    Period  Weeks    Status  New      PT SHORT TERM GOAL #3   Title  The patient will be able to walk 15-20 minutes with pain level 3/10  Time  4    Period  Weeks    Status  New      PT SHORT TERM GOAL #4   Title  Lumbar flexion improved to 65 degrees and 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    Time  4    Period  Weeks    Status  New        PT Long Term Goals - 02/01/19 1046      PT LONG TERM GOAL #1   Title  The patient will be independent in safe self progression of HEP    Time  8    Period  Weeks    Status  New    Target Date  03/29/19      PT LONG TERM GOAL #2   Title  The patient will report a 60% improvement in sleep    Time  8    Period  Weeks    Status  New      PT LONG TERM GOAL #3   Title  The patient will be able to walk 30 minutes with pain level 2/10 or less    Time  8    Period  Weeks    Status  New      PT LONG TERM GOAL #4   Title  Trunk and left hip strength improved to 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  Temporary improvement with ES/heat in prone.   Recommend re-exploring extension or possibly lateral compartment techniques.   Symptoms continue to be peripheralized.    Examination-Activity Limitations  Sleep;Caring for Others;Lift;Locomotion Level;Bed Mobility;Bend;Sit    Rehab Potential  Good    PT Frequency  2x / week    PT Duration  8 weeks    PT Treatment/Interventions  ADLs/Self Care Home Management;Cryotherapy;Electrical Stimulation;Ultrasound;Traction;Moist Heat;Iontophoresis 4mg /ml Dexamethasone;Stair training;Therapeutic activities;Therapeutic exercise;Neuromuscular re-education;Manual techniques;Patient/family education;Dry needling;Taping;Spinal Manipulations    PT Next Visit Plan  reassess extension or lateral compartment per McK[place];  ES/heat    PT Home Exercise Plan  Access Code: [facility]       Patient will benefit from skilled therapeutic intervention in order to improve the following deficits and impairments:  Decreased range of motion, Difficulty walking, Decreased activity tolerance, Pain, Decreased strength, Postural dysfunction  Visit Diagnosis: Muscle weakness (generalized)  Acute left-sided low back pain with left-sided sciatica  Difficulty in walking, not elsewhere classified     Problem List Patient Active Problem List   Diagnosis Date Noted  . Hyperlipidemia 12/07/2018  . Palpitations 12/07/2018   12/09/2018, PT 02/22/19 7:51 PM Phone: [phone] Fax: [phone] 02/22/2019, 7:50 PM  [facility] [address] [place], [place], [place] Phone: [phone]   Fax:  [phone]  Name: MUNTAHA VERMETTE MRN: Wallace Cullens Date of Birth:  05/03/1970   

## 2019-02-27 ENCOUNTER — Ambulatory Visit: Payer: BC Managed Care – PPO | Admitting: Physical Therapy

## 2019-02-27 ENCOUNTER — Other Ambulatory Visit: Payer: Self-pay

## 2019-02-27 ENCOUNTER — Encounter: Payer: Self-pay | Admitting: Physical Therapy

## 2019-02-27 DIAGNOSIS — R262 Difficulty in walking, not elsewhere classified: Secondary | ICD-10-CM | POA: Diagnosis not present

## 2019-02-27 DIAGNOSIS — M5442 Lumbago with sciatica, left side: Secondary | ICD-10-CM

## 2019-02-27 DIAGNOSIS — M6281 Muscle weakness (generalized): Secondary | ICD-10-CM

## 2019-02-27 NOTE — Therapy (Signed)
[facility] [address] [place], [place], [address] Phone: [phone]   Fax:  [phone]  Physical Therapy Treatment  Patient Details  Name: GENEIVE SANDSTROM MRN: [id number] Date of Birth: 1970-12-26 Referring Provider (PT): Dr. Darrow Bussing   Encounter Date: 02/27/2019  PT End of Session - 02/27/19 1756    Visit Number  5    Date for PT Re-Evaluation  03/29/19    Authorization Type  BCBS    PT Start Time  1030   pt late   PT Stop Time  1115    PT Time Calculation (min)  45 min    Activity Tolerance  Patient tolerated treatment well       Past Medical History:  Diagnosis Date  . Hemorrhoids     Past Surgical History:  Procedure Laterality Date  . APPENDECTOMY    . TUBAL LIGATION      There were no vitals filed for this visit.  Subjective Assessment - 02/27/19 1030    Subjective  My leg is the same.    Diagnostic tests  none;  MRI if after PT    Patient Stated Goals  be able to do something without worrying about pain, get comfortable    Currently in Pain?  Yes    Pain Score  5     Pain Orientation  Left    Pain Type  Acute pain    Pain Radiating Towards  pain in knee/thigh left                       [facility] Adult PT Treatment/Exercise - 02/27/19 0001      Lumbar Exercises: Standing   Other Standing Lumbar Exercises  left wall sidegliding 10x (right shoulder to wall)     Other Standing Lumbar Exercises  extensions 5x; with left foot in chair       Lumbar Exercises: Sidelying   Other Sidelying Lumbar Exercises  right side over folded pillow 3 minutes       Lumbar Exercises: Prone   Other Prone Lumbar Exercises  press ups, with exhale, roadkill, hips offset 5x each       Moist Heat Therapy   Number Minutes Moist Heat  12 Minutes    Moist Heat Location  Lumbar Spine;Hip      Electrical Stimulation   Electrical Stimulation Location  Lt lumbar spine and posterior Lt leg     Electrical Stimulation Action  IFC prone over 1 pillow     Electrical Stimulation Parameters  12    Electrical Stimulation Goals  Pain      Manual Therapy   Manual therapy comments  PA extension mobs with exhale, press ups with overpressure 5x each              PT Education - 02/27/19 1750    Education Details  Access Code: [email or web address] lateral compartment techniques per [employer]) Educated  Patient    Methods  Explanation;Demonstration;Handout    Comprehension  Returned demonstration;Verbalized understanding       PT Short Term Goals - 02/01/19 1042      PT SHORT TERM GOAL #1   Title  The patient express understanding of basic self care and centralization principles    Time  4    Period  Weeks    Status  New    Target Date  03/01/19      PT SHORT TERM GOAL #2  Title  The patient will report a 30% improvement in left lumbosacral and left LE pain at night time for improved sleep    Time  4    Period  Weeks    Status  New      PT SHORT TERM GOAL #3   Title  The patient will be able to walk 15-20 minutes with pain level 3/10    Time  4    Period  Weeks    Status  New      PT SHORT TERM GOAL #4   Title  Lumbar flexion improved to 65 degrees and 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    Time  4    Period  Weeks    Status  New        PT Long Term Goals - 02/01/19 1046      PT LONG TERM GOAL #1   Title  The patient will be independent in safe self progression of HEP    Time  8    Period  Weeks    Status  New    Target Date  03/29/19      PT LONG TERM GOAL #2   Title  The patient will report a 60% improvement in sleep    Time  8    Period  Weeks    Status  New      PT LONG TERM GOAL #3   Title  The patient will be able to walk 30 minutes with pain level 2/10 or less    Time  8    Period  Weeks    Status  New      PT LONG TERM GOAL #4   Title  Trunk and left hip strength improved to 4 to 4+/5 needed for work  duties as a Quarry manager and stair climbing with greater ease    Time  8    Period  Weeks    Status  New      PT LONG TERM GOAL #5   Title  FOTO functional outcome score improved from 44% limitation to 28% limitation    Time  8    Period  Weeks    Status  New            Plan - 02/27/19 1100    Clinical Impression Statement  The patient reports no change in back or LE symptoms.  No better but no worse with sagittal plane extensions.  Explored lateral plane movements with no immediate change.  Therapist very closely monitoring response throughout session.  Temporary improvement with ES/heat in prone.    Examination-Activity Limitations  Sleep;Caring for Others;Lift;Locomotion Level;Bed Mobility;Bend;Sit    Examination-Participation Restrictions  Cleaning;Community Activity;Driving;Other    Rehab Potential  Good    PT Frequency  2x / week    PT Treatment/Interventions  ADLs/Self Care Home Management;Cryotherapy;Electrical Stimulation;Ultrasound;Traction;Moist Heat;Iontophoresis 4mg /ml Dexamethasone;Stair training;Therapeutic activities;Therapeutic exercise;Neuromuscular re-education;Manual techniques;Patient/family education;Dry needling;Taping;Spinal Manipulations    PT Next Visit Plan  check progress with STGS next visit;  assess response to lateral compartment exercises;  ES/heat    PT Home Exercise Plan  Access Code: [facility]       Patient will benefit from skilled therapeutic intervention in order to improve the following deficits and impairments:  Decreased range of motion, Difficulty walking, Decreased activity tolerance, Pain, Decreased strength, Postural dysfunction  Visit Diagnosis: Muscle weakness (generalized)  Acute left-sided low back pain with left-sided sciatica  Difficulty in  walking, not elsewhere classified     Problem List Patient Active Problem List   Diagnosis Date Noted  . Hyperlipidemia 12/07/2018  . Palpitations 12/07/2018   Lavinia SharpsStacy Landis Cassaro, PT 02/27/19  6:04 PM Phone: [phone] Fax: [phone] Vivien PrestoSimpson, Jovie Swanner C 02/27/2019, 6:04 PM  [facility] [address] [place], [place], [address] Phone: [phone]   Fax:  [phone]  Name: Wallace Cullenseresa L Koppelman MRN: [id number] Date of Birth: 07/26/1970

## 2019-02-27 NOTE — Patient Instructions (Signed)
Access Code: [facility]  URL: [email or web address]  Date: 02/27/2019  Prepared by: Ruben Im   Exercises Prone Press Up - 10 reps - 1 sets - 7x daily - 7x weekly Standing Lumbar Extension - 10 reps - 1 sets - 2x daily - 7x weekly Seated Correct Posture - 1 reps - 1 sets - 1x daily - 7x weekly Right Standing Lateral Shift Correction at Wall - Repetitions - 10 reps - 1 sets - 2x daily - 7x weekly Sidelying Thoracic Lumbar Rotation - 3 reps - 1 sets - 60 hold - 1x daily - 7x weekly Patient Education Sleep Hygiene

## 2019-03-01 ENCOUNTER — Ambulatory Visit: Payer: BC Managed Care – PPO | Admitting: Physical Therapy

## 2019-03-01 ENCOUNTER — Telehealth: Payer: Self-pay | Admitting: Physical Therapy

## 2019-03-01 NOTE — Telephone Encounter (Signed)
Called and left message for patient to call (regarding no-show for today's appt)

## 2019-03-06 ENCOUNTER — Other Ambulatory Visit: Payer: Self-pay

## 2019-03-06 ENCOUNTER — Encounter: Payer: Self-pay | Admitting: Physical Therapy

## 2019-03-06 ENCOUNTER — Ambulatory Visit: Payer: BC Managed Care – PPO | Admitting: Physical Therapy

## 2019-03-06 DIAGNOSIS — M5442 Lumbago with sciatica, left side: Secondary | ICD-10-CM

## 2019-03-06 DIAGNOSIS — R262 Difficulty in walking, not elsewhere classified: Secondary | ICD-10-CM | POA: Diagnosis not present

## 2019-03-06 DIAGNOSIS — M6281 Muscle weakness (generalized): Secondary | ICD-10-CM

## 2019-03-06 NOTE — Therapy (Addendum)
[facility] [address], [place], [place], [address] Phone: [phone]   Fax:  [phone]  Physical Therapy Treatment/Discharge Summary   Patient Details  Name: Pamela Johnson MRN: [id number] Date of Birth: 1970-03-28 Referring Provider (PT): Dr. Lujean Amel   Encounter Date: 03/06/2019  PT End of Session - 03/06/19 1026    Visit Number  6    Date for PT Re-Evaluation  03/29/19    Authorization Type  BCBS    PT Start Time  1022    PT Stop Time  1105    PT Time Calculation (min)  43 min    Activity Tolerance  Patient tolerated treatment well       Past Medical History:  Diagnosis Date  . Hemorrhoids     Past Surgical History:  Procedure Laterality Date  . APPENDECTOMY    . TUBAL LIGATION      There were no vitals filed for this visit.  Subjective Assessment - 03/06/19 1024    Subjective  The (lateral) ex's made my back achey.  Top of calf is the same.  Left low back/hip.    Patient Stated Goals  be able to do something without worrying about pain, get comfortable    Currently in Pain?  Yes    Pain Score  5     Pain Location  Back    Pain Orientation  Left    Aggravating Factors   sitting, lying down    Pain Relieving Factors  moving around lessens but does not go away         [facility] PT Assessment - 03/06/19 0001      Observation/Other Assessments   Focus on Therapeutic Outcomes (FOTO)   37% limitation       AROM   Lumbar Flexion  70   mildly painful   Lumbar Extension  30                   [facility] Adult PT Treatment/Exercise - 03/06/19 0001      Lumbar Exercises: Stretches   Other Lumbar Stretch Exercise  foot on 2nd step rock 5x right/left       Lumbar Exercises: Aerobic   UBE (Upper Arm Bike)  4 min alternating directions       Lumbar Exercises: Standing   Heel Raises  20 reps    Wall Slides  15 reps    Wall Slides Limitations  with red ball behind back     Row   Strengthening    Theraband Level (Row)  Level 3 (Green)    Shoulder Extension  Strengthening;Both;20 reps    Theraband Level (Shoulder Extension)  Level 3 (Green)      Lumbar Exercises: Seated   Sit to Stand  10 reps    Sit to Stand Limitations  from high table       Moist Heat Therapy   Number Minutes Moist Heat  12 Minutes    Moist Heat Location  Lumbar Spine;Hip      Electrical Stimulation   Electrical Stimulation Location  Lt lumbar spine and posterior Lt leg    Electrical Stimulation Action  IFC prone over 1 pillow     Electrical Stimulation Parameters  12    Electrical Stimulation Goals  Pain               PT Short Term Goals - 03/06/19 1105      PT SHORT TERM GOAL #1  Title  The patient express understanding of basic self care and centralization principles    Status  Achieved      PT SHORT TERM GOAL #2   Title  The patient will report a 30% improvement in left lumbosacral and left LE pain at night time for improved sleep    Time  4    Period  Weeks    Status  On-going      PT SHORT TERM GOAL #3   Title  The patient will be able to walk 15-20 minutes with pain level 3/10    Time  4    Period  Weeks    Status  On-going      PT SHORT TERM GOAL #4   Title  Lumbar flexion improved to 65 degrees and extension to 25 degrees 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, clinical        PT Long Term Goals - 02/01/19 1046      PT LONG TERM GOAL #1   Title  The patient will be independent in safe self progression of HEP    Time  8    Period  Weeks    Status  New    Target Date  03/29/19      PT LONG TERM GOAL #2   Title  The patient will report a 60% improvement in sleep    Time  8    Period  Weeks    Status  New      PT LONG TERM GOAL #3   Title  The patient will be able to walk 30 minutes with pain level 2/10 or less    Time  8    Period  Weeks    Status  New      PT LONG TERM GOAL #4   Title  Trunk and left hip strength improved to  4 to 4+/5 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  Her pain is worsened with sitting to drive to work (even 10 minutes) and with lying down affecting sleep.  She has fewer symptoms with standing/walking and she continues to work.  Her lumbar ROM is improved to 70 degrees and extension to 30 degrees.  Her FOTO functional outcome score has also improved from 44% limitation to 37%.  She is able to perform functional strengthening today in standing without an exacerbation of pain.  She needs verbal cues to avoid knee hyperxtension and to very closely monitor response.  She has not responded to McKenzie method including sagittal and lateral compartment directions, lumbar traction, dry needling.  We discussed following up with her doctor to determine if any other medical interventions may be helpful in relieving distal symptoms and back/hip pain.    Examination-Activity Limitations  Sleep;Caring for Others;Lift;Locomotion Level;Bed Mobility;Bend;Sit    Examination-Participation Restrictions  Cleaning;Community Activity;Driving;Other    Rehab Potential  Good  PT Frequency  2x / week    PT Duration  8 weeks    PT Treatment/Interventions  ADLs/Self Care Home Management;Cryotherapy;Electrical Stimulation;Ultrasound;Traction;Moist Heat;Iontophoresis 90m/ml Dexamethasone;Stair training;Therapeutic activities;Therapeutic exercise;Neuromuscular re-education;Manual techniques;Patient/family education;Dry needling;Taping;Spinal Manipulations    PT Next Visit Plan  patient to call doctor for follow up;  standing core and functional strength;  ES/heat    PT  Home Exercise Plan  [facility]      Patient will benefit from skilled therapeutic intervention in order to improve the following deficits and impairments:  Decreased range of motion, Difficulty walking, Decreased activity tolerance, Pain, Decreased strength, Postural dysfunction  Visit Diagnosis: Muscle weakness (generalized)  Acute left-sided low back pain with left-sided sciatica  Difficulty in walking, not elsewhere classified    PHYSICAL THERAPY DISCHARGE SUMMARY  Visits from Start of Care: 6  Current functional level related to goals / functional outcomes: The patient did not attend her last scheduled appointment and her PT chart has been inactive for over 2 months.  Will discharge from PT at this time.     Remaining deficits: As above   Education / Equipment: HEP Plan: Patient agrees to discharge.  Patient goals were not met. Patient is being discharged due to not returning since the last visit.  ?????      Problem List Patient Active Problem List   Diagnosis Date Noted  . Hyperlipidemia 12/07/2018  . Palpitations 12/07/2018   SRuben Im PT 03/06/19 11:29 AM Phone: [phone]: [phone] Singh12/22/2020, 11:28 AM  [facility] [address] [place] [address]Phone: [phone]  Fax:  [phone] Name: TTAKERA RAYLMRN: [id number] of Birth: 1Apr 30, 1972

## 2019-03-13 ENCOUNTER — Ambulatory Visit: Payer: BC Managed Care – PPO | Admitting: Physical Therapy

## 2019-03-14 ENCOUNTER — Telehealth: Payer: Self-pay

## 2019-03-14 NOTE — Telephone Encounter (Signed)
Pt called asking if they should still be taking lipitor or if they needed another LDL check - based of last LDL in November, advised pt to take medication as prescribed.

## 2019-03-20 ENCOUNTER — Ambulatory Visit (INDEPENDENT_AMBULATORY_CARE_PROVIDER_SITE_OTHER): Payer: BC Managed Care – PPO

## 2019-03-20 ENCOUNTER — Ambulatory Visit: Payer: BC Managed Care – PPO | Admitting: Podiatry

## 2019-03-20 ENCOUNTER — Encounter: Payer: Self-pay | Admitting: Podiatry

## 2019-03-20 ENCOUNTER — Other Ambulatory Visit: Payer: Self-pay

## 2019-03-20 VITALS — BP 112/71 | HR 78 | Resp 16

## 2019-03-20 DIAGNOSIS — M722 Plantar fascial fibromatosis: Secondary | ICD-10-CM | POA: Diagnosis not present

## 2019-03-20 MED ORDER — MELOXICAM 15 MG PO TABS: 15.0000 mg | ORAL_TABLET | Freq: Every day | ORAL | 3 refills | Status: AC

## 2019-03-20 MED ORDER — METHYLPREDNISOLONE 4 MG PO TBPK: ORAL_TABLET | ORAL | 0 refills | Status: DC

## 2019-03-20 NOTE — Patient Instructions (Signed)

## 2019-03-21 ENCOUNTER — Ambulatory Visit
Admission: RE | Admit: 2019-03-21 | Discharge: 2019-03-21 | Disposition: A | Discharge status: Home / Self Care | Payer: BC Managed Care – PPO | Source: Ambulatory Visit | Attending: Family Medicine | Admitting: Family Medicine

## 2019-03-21 ENCOUNTER — Other Ambulatory Visit: Payer: Self-pay | Admitting: Family Medicine

## 2019-03-21 DIAGNOSIS — M25559 Pain in unspecified hip: Secondary | ICD-10-CM

## 2019-03-21 DIAGNOSIS — M5432 Sciatica, left side: Secondary | ICD-10-CM | POA: Diagnosis not present

## 2019-03-21 DIAGNOSIS — M25552 Pain in left hip: Secondary | ICD-10-CM | POA: Diagnosis not present

## 2019-03-21 NOTE — Progress Notes (Signed)
Subjective:  Patient ID: Pamela Johnson, female    DOB: 1971/02/16,  MRN: [id number] HPI Chief Complaint  Patient presents with  . 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 New Patient (Initial Visit)    49 y.o. female presents with the above complaint.   ROS: Denies fever chills nausea vomiting muscle aches pains calf pain back pain chest pain shortness of breath.  Past Medical History:  Diagnosis Date  . Hemorrhoids    Past Surgical History:  Procedure Laterality Date  . APPENDECTOMY    . TUBAL LIGATION      Current Outpatient Medications:  .  atorvastatin (LIPITOR) 20 MG tablet, Take 1 tablet (20 mg total) by mouth daily., Disp: 90 tablet, Rfl: 3 .  ibuprofen (ADVIL,MOTRIN) 800 MG tablet, Take 1 tablet (800 mg total) by mouth 3 (three) times daily., Disp: 21 tablet, Rfl: 0 .  meloxicam (MOBIC) 15 MG tablet, Take 1 tablet (15 mg total) by mouth daily., Disp: 30 tablet, Rfl: 3 .  methylPREDNISolone (MEDROL DOSEPAK) 4 MG TBPK tablet, 6 day dose pack - take as directed, Disp: 21 tablet, Rfl: 0 .  naproxen sodium (ANAPROX) 220 MG tablet, Take 220 mg by mouth daily as needed (pain)., Disp: , Rfl:   No Known Allergies Review of Systems Objective:   Vitals:   03/20/19 1459  BP: 112/71  Pulse: 78  Resp: 16    General: Well developed, nourished, in no acute distress, alert and oriented x3   Dermatological: Skin is warm, dry and supple bilateral. Nails x 10 are well maintained; remaining integument appears unremarkable at this time. There are no open sores, no preulcerative lesions, no rash or signs of infection present.  Vascular: Dorsalis Pedis artery and Posterior Tibial artery pedal pulses are 2/4 bilateral with immedate capillary fill time. Pedal hair growth present. No varicosities and no lower extremity edema present bilateral.   Neruologic: Grossly intact via light touch bilateral.  Vibratory intact via tuning fork bilateral. Protective threshold with Semmes Wienstein monofilament intact to all pedal sites bilateral. Patellar and Achilles deep tendon reflexes 2+ bilateral. No Babinski or clonus noted bilateral.   Musculoskeletal: No gross boney pedal deformities bilateral. No pain, crepitus, or limitation noted with foot and ankle range of motion bilateral. Muscular strength 5/5 in all groups tested bilateral.  She has pain on palpation medial calcaneal tubercle right greater than left.  No Achilles pain no lateral pain minimal forefoot symptoms.  Gait: Unassisted, Nonantalgic.    Radiographs:  Radiographs taken today demonstrate soft tissue increase in density plantar fashion calcaneal insertion site with worse spurring and soft tissue increase at the plantar fascial cancer site on the right foot than that of the left.  Otherwise I will see any significant acute abnormalities.  Assessment & Plan:   Assessment: Plantar fasciitis right greater than left  Plan: We discussed etiology pathology conservative versus surgical therapies.  At this point I will put her on a Medrol Dosepak 4 mg followed by her Aleve.  I went ahead and injected the bilateral heels today with 20 mg Kenalog 5 mg Marcaine for maximal tenderness.  Tolerated procedure well without complications.  Discussed appropriate shoe gear stretching exercise ice therapy shoe gear modification dispensed a plantar fascial brace bilaterally and a single night splint.  I will follow-up with her in 1 month.     Avyukt Cimo T.  [place], [place]

## 2019-03-23 ENCOUNTER — Other Ambulatory Visit: Payer: Self-pay | Admitting: Family Medicine

## 2019-03-23 DIAGNOSIS — M5432 Sciatica, left side: Secondary | ICD-10-CM

## 2019-03-28 ENCOUNTER — Other Ambulatory Visit: Payer: Self-pay

## 2019-03-28 ENCOUNTER — Ambulatory Visit
Admission: RE | Admit: 2019-03-28 | Discharge: 2019-03-28 | Disposition: A | Discharge status: Home / Self Care | Payer: BC Managed Care – PPO | Source: Ambulatory Visit | Attending: Family Medicine | Admitting: Family Medicine

## 2019-03-28 DIAGNOSIS — M5432 Sciatica, left side: Secondary | ICD-10-CM

## 2019-03-28 DIAGNOSIS — M545 Low back pain: Secondary | ICD-10-CM | POA: Diagnosis not present

## 2019-04-05 DIAGNOSIS — M259 Joint disorder, unspecified: Secondary | ICD-10-CM | POA: Diagnosis not present

## 2019-04-05 DIAGNOSIS — M545 Low back pain: Secondary | ICD-10-CM | POA: Diagnosis not present

## 2019-04-05 DIAGNOSIS — M9904 Segmental and somatic dysfunction of sacral region: Secondary | ICD-10-CM | POA: Diagnosis not present

## 2019-04-24 ENCOUNTER — Other Ambulatory Visit: Payer: Self-pay

## 2019-04-24 ENCOUNTER — Ambulatory Visit: Payer: BC Managed Care – PPO | Admitting: Podiatry

## 2019-04-24 ENCOUNTER — Encounter: Payer: Self-pay | Admitting: Podiatry

## 2019-04-24 DIAGNOSIS — M722 Plantar fascial fibromatosis: Secondary | ICD-10-CM

## 2019-04-24 NOTE — Progress Notes (Signed)
She presents today for follow-up of her bilateral heels.  States that they throb quite a bit but they seem to be doing better about 50%.  Objective: Vital signs are stable she is alert oriented x3.  Pulses are palpable.  We need to see about getting her some orthotics.  She has pain on palpation medial calcaneal tubercles of bilateral heels.  Assessment: Chronic intractable plantar fasciitis and over a year duration.  Plan: Went ahead and injected the heels bilaterally once again today and put her on Rex schedule for orthotics.

## 2019-05-09 DIAGNOSIS — B009 Herpesviral infection, unspecified: Secondary | ICD-10-CM | POA: Diagnosis not present

## 2019-05-29 ENCOUNTER — Ambulatory Visit: Payer: BC Managed Care – PPO | Admitting: Podiatry

## 2019-07-03 ENCOUNTER — Encounter: Payer: Self-pay | Admitting: Podiatry

## 2019-07-03 ENCOUNTER — Other Ambulatory Visit: Payer: Self-pay

## 2019-07-03 ENCOUNTER — Ambulatory Visit: Payer: BC Managed Care – PPO | Admitting: Podiatry

## 2019-07-03 DIAGNOSIS — M722 Plantar fascial fibromatosis: Secondary | ICD-10-CM | POA: Diagnosis not present

## 2019-07-03 DIAGNOSIS — L255 Unspecified contact dermatitis due to plants, except food: Secondary | ICD-10-CM | POA: Diagnosis not present

## 2019-07-03 NOTE — Progress Notes (Signed)
She presents today to pick up her orthotics she states that things are going to do just fine.  She states that my left heel is doing okay number right 1 is a bit sore.  Objective: Vital signs are stable she alert oriented x3 pulses are palpable bilateral.  Right foot demonstrates pain on palpation mid calcaneal tubercle of the right heel.  Assessment: Plantar fasciitis right.  Plan: Reinjected the right heel today 20 mg Kenalog 5 mg Marcaine point of maximal tenderness of the right heel and instructed her to wear her orthotics and I will follow-up with her in 1 month.

## 2019-07-31 ENCOUNTER — Ambulatory Visit: Payer: BC Managed Care – PPO | Admitting: Podiatry

## 2019-07-31 ENCOUNTER — Encounter: Payer: Self-pay | Admitting: Podiatry

## 2019-07-31 ENCOUNTER — Telehealth: Payer: Self-pay | Admitting: *Deleted

## 2019-07-31 ENCOUNTER — Other Ambulatory Visit: Payer: Self-pay

## 2019-07-31 DIAGNOSIS — M722 Plantar fascial fibromatosis: Secondary | ICD-10-CM

## 2019-07-31 DIAGNOSIS — T148XXA Other injury of unspecified body region, initial encounter: Secondary | ICD-10-CM

## 2019-07-31 NOTE — Telephone Encounter (Signed)
Orders to A. Prevette, CMA for pre-cert, faxed to [facility]. 

## 2019-07-31 NOTE — Progress Notes (Signed)
She presents today for follow-up of her right heel.  States that maybe is a little better than it was but not by much.  Objective: Vital signs are stable she is alert oriented x3 still has severe pain on palpation medial calcaneal tubercle of the right heel.  Assessment probable tear in the plantar fascia since is not resolving at all.  Plan: At this point I am requesting an MRI.

## 2019-07-31 NOTE — Telephone Encounter (Signed)
-----   Message from Kristian Covey, [facility] sent at 07/31/2019  2:26 PM EDT ----- Regarding: MRI MRI right ankle - evaluate plantar fascial tear right foot - surgical consideration

## 2019-08-07 NOTE — Telephone Encounter (Signed)
Called [employer] - Pre cert/Prior authorization not needed    Ref # [id number]

## 2019-08-09 NOTE — Telephone Encounter (Signed)
Orders with authorization notification faxed to [facility].

## 2019-08-16 ENCOUNTER — Other Ambulatory Visit: Payer: BC Managed Care – PPO

## 2019-08-20 ENCOUNTER — Other Ambulatory Visit: Payer: Self-pay | Admitting: Family Medicine

## 2019-08-20 ENCOUNTER — Ambulatory Visit: Payer: Self-pay

## 2019-08-20 ENCOUNTER — Other Ambulatory Visit: Payer: Self-pay

## 2019-08-20 DIAGNOSIS — M79644 Pain in right finger(s): Secondary | ICD-10-CM

## 2019-09-11 ENCOUNTER — Other Ambulatory Visit: Payer: BC Managed Care – PPO

## 2019-10-05 ENCOUNTER — Other Ambulatory Visit: Payer: Self-pay | Admitting: Family Medicine

## 2019-10-05 DIAGNOSIS — Z1231 Encounter for screening mammogram for malignant neoplasm of breast: Secondary | ICD-10-CM

## 2019-10-15 DIAGNOSIS — Z20822 Contact with and (suspected) exposure to covid-19: Secondary | ICD-10-CM | POA: Diagnosis not present

## 2019-10-30 DIAGNOSIS — H9201 Otalgia, right ear: Secondary | ICD-10-CM | POA: Diagnosis not present

## 2019-11-02 ENCOUNTER — Other Ambulatory Visit: Payer: Self-pay

## 2019-11-02 ENCOUNTER — Ambulatory Visit
Admission: RE | Admit: 2019-11-02 | Discharge: 2019-11-02 | Disposition: A | Discharge status: Home / Self Care | Payer: BC Managed Care – PPO | Source: Ambulatory Visit | Attending: Family Medicine | Admitting: Family Medicine

## 2019-11-02 DIAGNOSIS — Z1231 Encounter for screening mammogram for malignant neoplasm of breast: Secondary | ICD-10-CM | POA: Diagnosis not present

## 2019-11-19 ENCOUNTER — Encounter: Payer: Self-pay | Admitting: Podiatry

## 2019-11-22 ENCOUNTER — Other Ambulatory Visit: Payer: Self-pay | Admitting: Podiatry

## 2019-11-22 DIAGNOSIS — S93691D Other sprain of right foot, subsequent encounter: Secondary | ICD-10-CM

## 2019-11-22 DIAGNOSIS — M722 Plantar fascial fibromatosis: Secondary | ICD-10-CM

## 2019-11-22 DIAGNOSIS — T148XXA Other injury of unspecified body region, initial encounter: Secondary | ICD-10-CM

## 2019-12-14 ENCOUNTER — Other Ambulatory Visit: Payer: BC Managed Care – PPO

## 2020-02-05 DIAGNOSIS — M259 Joint disorder, unspecified: Secondary | ICD-10-CM | POA: Diagnosis not present

## 2020-02-05 DIAGNOSIS — M5459 Other low back pain: Secondary | ICD-10-CM | POA: Diagnosis not present

## 2020-02-11 ENCOUNTER — Other Ambulatory Visit: Payer: Self-pay | Admitting: Orthopedic Surgery

## 2020-02-11 DIAGNOSIS — M259 Joint disorder, unspecified: Secondary | ICD-10-CM

## 2020-02-28 ENCOUNTER — Other Ambulatory Visit: Payer: Self-pay

## 2020-02-28 ENCOUNTER — Ambulatory Visit
Admission: RE | Admit: 2020-02-28 | Discharge: 2020-02-28 | Disposition: A | Discharge status: Home / Self Care | Payer: BC Managed Care – PPO | Source: Ambulatory Visit | Attending: Orthopedic Surgery | Admitting: Orthopedic Surgery

## 2020-02-28 DIAGNOSIS — M79652 Pain in left thigh: Secondary | ICD-10-CM | POA: Diagnosis not present

## 2020-02-28 DIAGNOSIS — M259 Joint disorder, unspecified: Secondary | ICD-10-CM

## 2020-03-10 DIAGNOSIS — M9904 Segmental and somatic dysfunction of sacral region: Secondary | ICD-10-CM | POA: Diagnosis not present

## 2020-03-10 DIAGNOSIS — M259 Joint disorder, unspecified: Secondary | ICD-10-CM | POA: Diagnosis not present

## 2020-03-13 ENCOUNTER — Other Ambulatory Visit: Payer: Self-pay | Admitting: Orthopedic Surgery

## 2020-03-13 DIAGNOSIS — M259 Joint disorder, unspecified: Secondary | ICD-10-CM

## 2020-04-02 ENCOUNTER — Ambulatory Visit
Admission: RE | Admit: 2020-04-02 | Discharge: 2020-04-02 | Disposition: A | Discharge status: Home / Self Care | Payer: BC Managed Care – PPO | Source: Ambulatory Visit | Attending: Orthopedic Surgery | Admitting: Orthopedic Surgery

## 2020-04-02 ENCOUNTER — Other Ambulatory Visit: Payer: Self-pay

## 2020-04-02 DIAGNOSIS — M259 Joint disorder, unspecified: Secondary | ICD-10-CM

## 2020-04-02 DIAGNOSIS — M533 Sacrococcygeal disorders, not elsewhere classified: Secondary | ICD-10-CM | POA: Diagnosis not present

## 2020-04-02 MED ORDER — METHYLPREDNISOLONE ACETATE 40 MG/ML INJ SUSP (RADIOLOG
120.0000 mg | Freq: Once | INTRAMUSCULAR | Status: AC
  Administered 2020-04-02: 120 mg via INTRA_ARTICULAR

## 2020-04-15 DIAGNOSIS — Z01419 Encounter for gynecological examination (general) (routine) without abnormal findings: Secondary | ICD-10-CM | POA: Diagnosis not present

## 2020-04-21 DIAGNOSIS — M9904 Segmental and somatic dysfunction of sacral region: Secondary | ICD-10-CM | POA: Diagnosis not present

## 2020-04-28 ENCOUNTER — Ambulatory Visit (HOSPITAL_BASED_OUTPATIENT_CLINIC_OR_DEPARTMENT_OTHER): Payer: BC Managed Care – PPO | Attending: Orthopedic Surgery | Admitting: Physical Therapy

## 2020-04-29 DIAGNOSIS — Z01812 Encounter for preprocedural laboratory examination: Secondary | ICD-10-CM | POA: Diagnosis not present

## 2020-04-29 DIAGNOSIS — M25552 Pain in left hip: Secondary | ICD-10-CM | POA: Diagnosis not present

## 2020-04-29 DIAGNOSIS — M545 Low back pain, unspecified: Secondary | ICD-10-CM | POA: Diagnosis not present

## 2020-05-02 DIAGNOSIS — K648 Other hemorrhoids: Secondary | ICD-10-CM | POA: Diagnosis not present

## 2020-05-02 DIAGNOSIS — D123 Benign neoplasm of transverse colon: Secondary | ICD-10-CM | POA: Diagnosis not present

## 2020-05-02 DIAGNOSIS — K621 Rectal polyp: Secondary | ICD-10-CM | POA: Diagnosis not present

## 2020-05-02 DIAGNOSIS — Z1211 Encounter for screening for malignant neoplasm of colon: Secondary | ICD-10-CM | POA: Diagnosis not present

## 2020-05-02 DIAGNOSIS — K5730 Diverticulosis of large intestine without perforation or abscess without bleeding: Secondary | ICD-10-CM | POA: Diagnosis not present

## 2020-05-05 DIAGNOSIS — M25552 Pain in left hip: Secondary | ICD-10-CM | POA: Diagnosis not present

## 2020-05-05 DIAGNOSIS — M545 Low back pain, unspecified: Secondary | ICD-10-CM | POA: Diagnosis not present

## 2020-05-09 DIAGNOSIS — M545 Low back pain, unspecified: Secondary | ICD-10-CM | POA: Diagnosis not present

## 2020-05-16 DIAGNOSIS — M9904 Segmental and somatic dysfunction of sacral region: Secondary | ICD-10-CM | POA: Diagnosis not present

## 2020-05-19 DIAGNOSIS — M9904 Segmental and somatic dysfunction of sacral region: Secondary | ICD-10-CM | POA: Diagnosis not present

## 2020-05-26 DIAGNOSIS — M9904 Segmental and somatic dysfunction of sacral region: Secondary | ICD-10-CM | POA: Diagnosis not present

## 2020-05-30 DIAGNOSIS — M9904 Segmental and somatic dysfunction of sacral region: Secondary | ICD-10-CM | POA: Diagnosis not present

## 2020-06-13 DIAGNOSIS — M9904 Segmental and somatic dysfunction of sacral region: Secondary | ICD-10-CM | POA: Diagnosis not present

## 2020-08-08 DIAGNOSIS — M25511 Pain in right shoulder: Secondary | ICD-10-CM | POA: Diagnosis not present

## 2020-08-14 DIAGNOSIS — M67911 Unspecified disorder of synovium and tendon, right shoulder: Secondary | ICD-10-CM | POA: Diagnosis not present

## 2020-08-15 DIAGNOSIS — M25511 Pain in right shoulder: Secondary | ICD-10-CM | POA: Diagnosis not present

## 2020-09-19 ENCOUNTER — Other Ambulatory Visit: Payer: Self-pay | Admitting: Family Medicine

## 2020-09-19 DIAGNOSIS — Z1231 Encounter for screening mammogram for malignant neoplasm of breast: Secondary | ICD-10-CM

## 2020-11-13 ENCOUNTER — Ambulatory Visit
Admission: RE | Admit: 2020-11-13 | Discharge: 2020-11-13 | Disposition: A | Discharge status: Home / Self Care | Payer: BC Managed Care – PPO | Source: Ambulatory Visit | Attending: Family Medicine | Admitting: Family Medicine

## 2020-11-13 ENCOUNTER — Other Ambulatory Visit: Payer: Self-pay

## 2020-11-13 DIAGNOSIS — Z1231 Encounter for screening mammogram for malignant neoplasm of breast: Secondary | ICD-10-CM

## 2021-01-22 DIAGNOSIS — B070 Plantar wart: Secondary | ICD-10-CM | POA: Diagnosis not present

## 2021-02-02 DIAGNOSIS — M545 Low back pain, unspecified: Secondary | ICD-10-CM | POA: Diagnosis not present

## 2021-02-04 ENCOUNTER — Other Ambulatory Visit: Payer: Self-pay | Admitting: Orthopedic Surgery

## 2021-02-04 DIAGNOSIS — M545 Low back pain, unspecified: Secondary | ICD-10-CM

## 2021-02-17 ENCOUNTER — Other Ambulatory Visit: Payer: Self-pay

## 2021-02-17 ENCOUNTER — Ambulatory Visit
Admission: RE | Admit: 2021-02-17 | Discharge: 2021-02-17 | Disposition: A | Discharge status: Home / Self Care | Payer: BC Managed Care – PPO | Source: Ambulatory Visit | Attending: Orthopedic Surgery | Admitting: Orthopedic Surgery

## 2021-02-17 DIAGNOSIS — M545 Low back pain, unspecified: Secondary | ICD-10-CM

## 2021-02-17 DIAGNOSIS — M533 Sacrococcygeal disorders, not elsewhere classified: Secondary | ICD-10-CM | POA: Diagnosis not present

## 2021-02-17 MED ORDER — METHYLPREDNISOLONE ACETATE 40 MG/ML INJ SUSP (RADIOLOG
80.0000 mg | Freq: Once | INTRAMUSCULAR | Status: AC
  Administered 2021-02-17: 80 mg via INTRA_ARTICULAR

## 2021-04-14 DIAGNOSIS — H6981 Other specified disorders of Eustachian tube, right ear: Secondary | ICD-10-CM | POA: Diagnosis not present

## 2021-04-15 DIAGNOSIS — Z01419 Encounter for gynecological examination (general) (routine) without abnormal findings: Secondary | ICD-10-CM | POA: Diagnosis not present

## 2021-04-29 DIAGNOSIS — H9201 Otalgia, right ear: Secondary | ICD-10-CM | POA: Diagnosis not present

## 2021-05-05 DIAGNOSIS — J040 Acute laryngitis: Secondary | ICD-10-CM | POA: Diagnosis not present

## 2021-09-09 ENCOUNTER — Other Ambulatory Visit: Payer: Self-pay | Admitting: Orthopedic Surgery

## 2021-09-09 DIAGNOSIS — M545 Low back pain, unspecified: Secondary | ICD-10-CM

## 2021-09-18 ENCOUNTER — Ambulatory Visit
Admission: RE | Admit: 2021-09-18 | Discharge: 2021-09-18 | Disposition: A | Discharge status: Home / Self Care | Payer: BC Managed Care – PPO | Source: Ambulatory Visit | Attending: Orthopedic Surgery | Admitting: Orthopedic Surgery

## 2021-09-18 DIAGNOSIS — M533 Sacrococcygeal disorders, not elsewhere classified: Secondary | ICD-10-CM | POA: Diagnosis not present

## 2021-09-18 DIAGNOSIS — G8929 Other chronic pain: Secondary | ICD-10-CM

## 2021-09-18 MED ORDER — METHYLPREDNISOLONE ACETATE 40 MG/ML INJ SUSP (RADIOLOG
120.0000 mg | Freq: Once | INTRAMUSCULAR | Status: AC
  Administered 2021-09-18: 120 mg via EPIDURAL

## 2021-09-18 MED ORDER — IOPAMIDOL (ISOVUE-M 200) INJECTION 41%
1.0000 mL | Freq: Once | INTRAMUSCULAR | Status: AC
  Administered 2021-09-18: 1 mL via EPIDURAL

## 2021-10-12 ENCOUNTER — Ambulatory Visit (INDEPENDENT_AMBULATORY_CARE_PROVIDER_SITE_OTHER): Payer: BC Managed Care – PPO

## 2021-10-12 ENCOUNTER — Encounter: Payer: Self-pay | Admitting: Podiatry

## 2021-10-12 ENCOUNTER — Ambulatory Visit: Payer: BC Managed Care – PPO | Admitting: Podiatry

## 2021-10-12 DIAGNOSIS — M722 Plantar fascial fibromatosis: Secondary | ICD-10-CM

## 2021-10-12 MED ORDER — TRIAMCINOLONE ACETONIDE 10 MG/ML IJ SUSP
10.0000 mg | Freq: Once | INTRAMUSCULAR | Status: AC
Start: 2021-10-12 — End: 2021-10-12
  Administered 2021-10-12: 10 mg

## 2021-10-12 NOTE — Progress Notes (Signed)
Subjective:   Patient ID: Pamela Johnson, female   DOB: 51 y.o.   MRN: [id number]   HPI Patient states she is developed a lot of pain in her left arch over the last few weeks and also has developed some dorsal pain left after dropping something on her forefoot several days ago.  Patient states that heels been hurting her for approximately a month worse over the last couple weeks   ROS      Objective:  Physical Exam  Neurovascular status intact muscle strength adequate no indications of tendon tear with patient found to have some bruising of the lesser MPJs left with inflammation pain of the mid arch towards the heel left that is very tender when pressed     Assessment:  Acute plantar fasciitis left with inflammation fluid buildup and trauma to the left forefoot cannot rule out bony injury     Plan:  H&P x-ray reviewed left and went ahead today and did sterile prep and injected the proximal band of the fascia distal to its insertion calcaneus 3 mg Kenalog 5 mg Xylocaine and applied fascial brace to lift up the arch applying it to her foot explaining how old her arch up properly and take stress off the plantar fascia.  Reappoint 3 weeks to recheck  X-rays indicate that there is moderate depression of the arch no indications of forefoot pathology from injury

## 2021-10-20 ENCOUNTER — Telehealth: Payer: Self-pay

## 2021-10-21 NOTE — Telephone Encounter (Signed)
Patient states that she has been working and is still having the tingling sensation but nothing major and will continue to keep and eye on it.

## 2021-11-04 ENCOUNTER — Ambulatory Visit: Payer: BC Managed Care – PPO | Admitting: Podiatry

## 2021-11-04 ENCOUNTER — Encounter: Payer: Self-pay | Admitting: Podiatry

## 2021-11-04 DIAGNOSIS — M722 Plantar fascial fibromatosis: Secondary | ICD-10-CM | POA: Diagnosis not present

## 2021-11-05 NOTE — Progress Notes (Signed)
Subjective:   Patient ID: Pamela Johnson, female   DOB: 51 y.o.   MRN: [id number]   HPI Patient states that the arch pain is better and that still getting some discomfort on the outside of the foot but difficult to describe and seems like she has a little bit of tingling in her big toe but that its not truly bothersome   ROS      Objective:  Physical Exam  Neurovascular status intact with patient's left arch doing much better reduced discomfort upon palpation with patient noted to have mild discomfort laterally that is localized but diffuse and also no indications of pathology around the big toe     Assessment:  Symptoms may be due to compensatory gait pattern or return to normal gait pattern after walking abnormally     Plan:  Reviewed condition at this point do not recommend treatment and I did recommend stretching exercises and continued shoe gear modifications.  If symptoms were to give her problems she is to reappoint but I am hopeful at this point that these will completely work out over time side visit

## 2021-11-18 ENCOUNTER — Other Ambulatory Visit: Payer: Self-pay | Admitting: Family Medicine

## 2021-11-18 DIAGNOSIS — Z1231 Encounter for screening mammogram for malignant neoplasm of breast: Secondary | ICD-10-CM

## 2021-11-25 DIAGNOSIS — M65311 Trigger thumb, right thumb: Secondary | ICD-10-CM | POA: Diagnosis not present

## 2021-11-30 DIAGNOSIS — M65311 Trigger thumb, right thumb: Secondary | ICD-10-CM | POA: Diagnosis not present

## 2021-12-07 ENCOUNTER — Ambulatory Visit
Admission: RE | Admit: 2021-12-07 | Discharge: 2021-12-07 | Disposition: A | Discharge status: Home / Self Care | Payer: BC Managed Care – PPO | Source: Ambulatory Visit | Attending: Family Medicine | Admitting: Family Medicine

## 2021-12-07 DIAGNOSIS — Z1231 Encounter for screening mammogram for malignant neoplasm of breast: Secondary | ICD-10-CM | POA: Diagnosis not present

## 2022-01-23 IMAGING — MG MM DIGITAL SCREENING BILAT W/ TOMO AND CAD
8 series · 8 of 24 positions shown · non-contrast
Comparison: Previous exam(s).

CLINICAL DATA: Screening.

EXAM:
DIGITAL SCREENING BILATERAL MAMMOGRAM WITH TOMOSYNTHESIS AND CAD
TECHNIQUE: Bilateral screening digital craniocaudal and mediolateral oblique
mammograms were obtained. Bilateral screening digital breast
tomosynthesis was performed. The images were evaluated with
computer-aided detection.

[L MLO synth-2D]
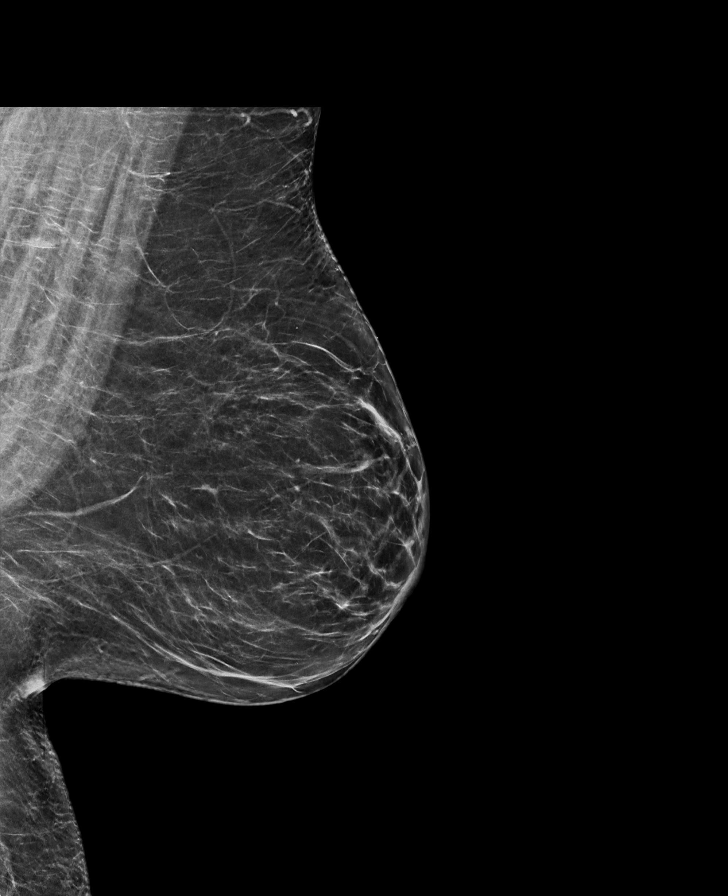

[R MLO synth-2D]
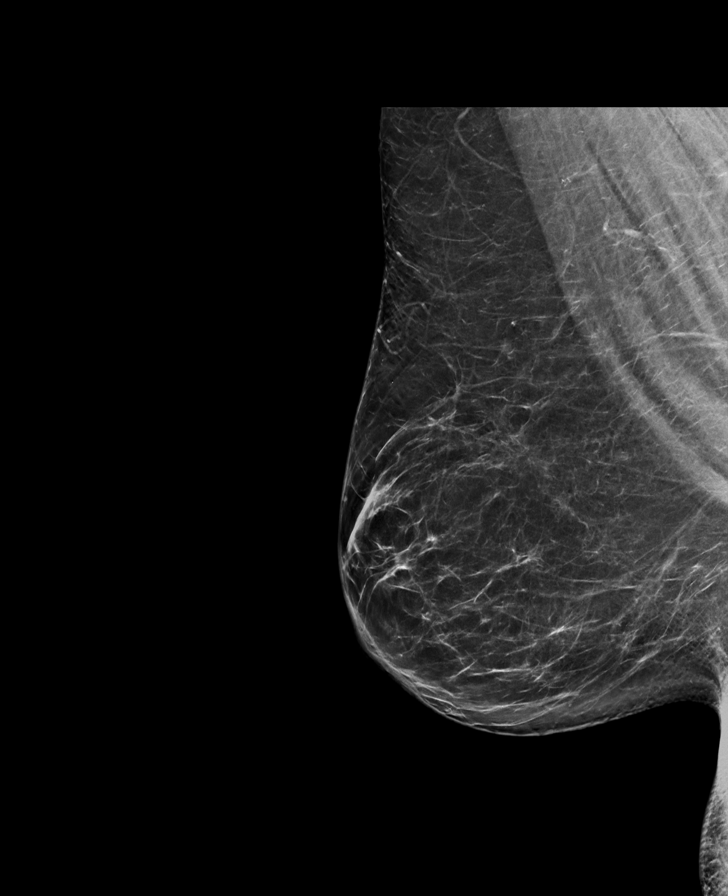

[L CC synth-2D]
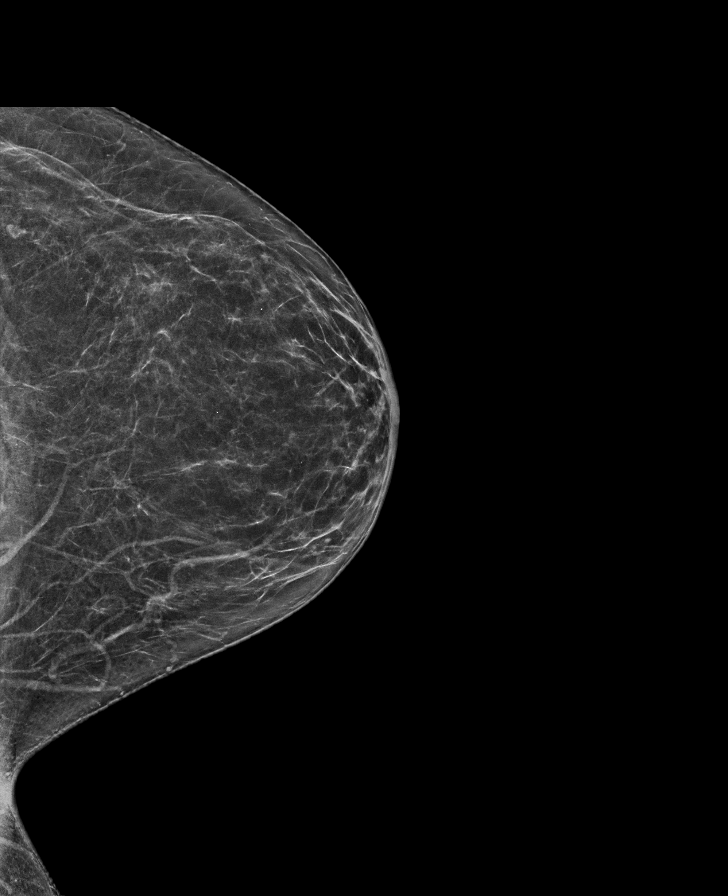

[R CC synth-2D]
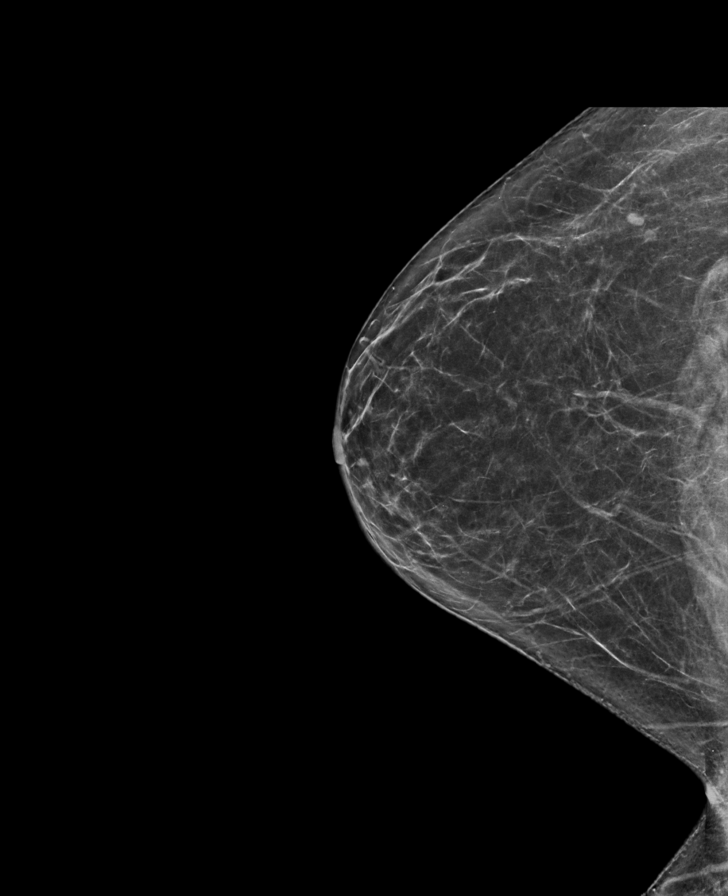

[L MLO tomo · tomo slice 39/76.0]
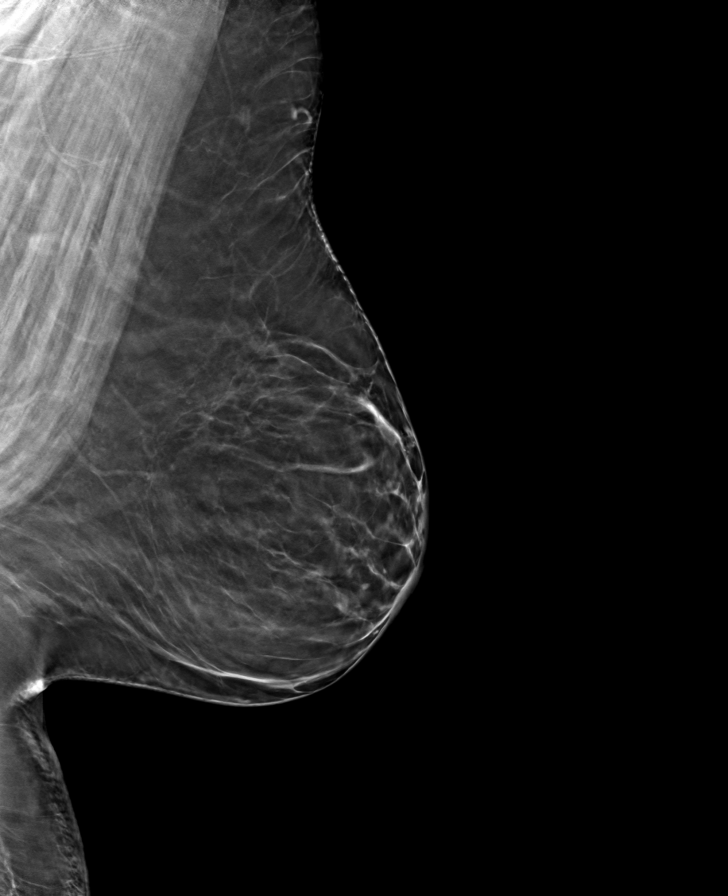

[L CC tomo · tomo slice 31/60.0]
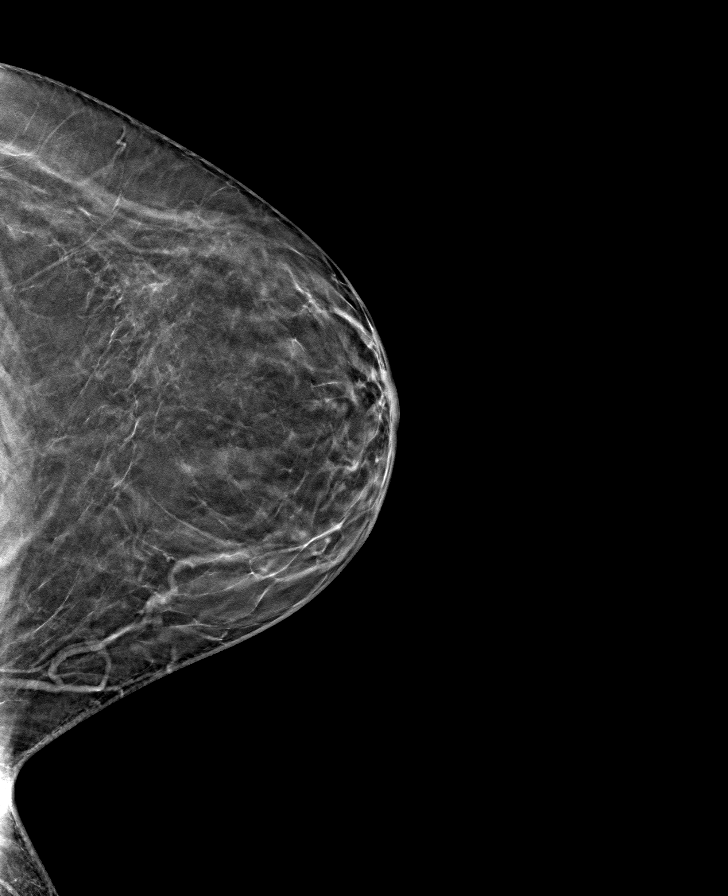

[R MLO tomo · tomo slice 41/81.0]
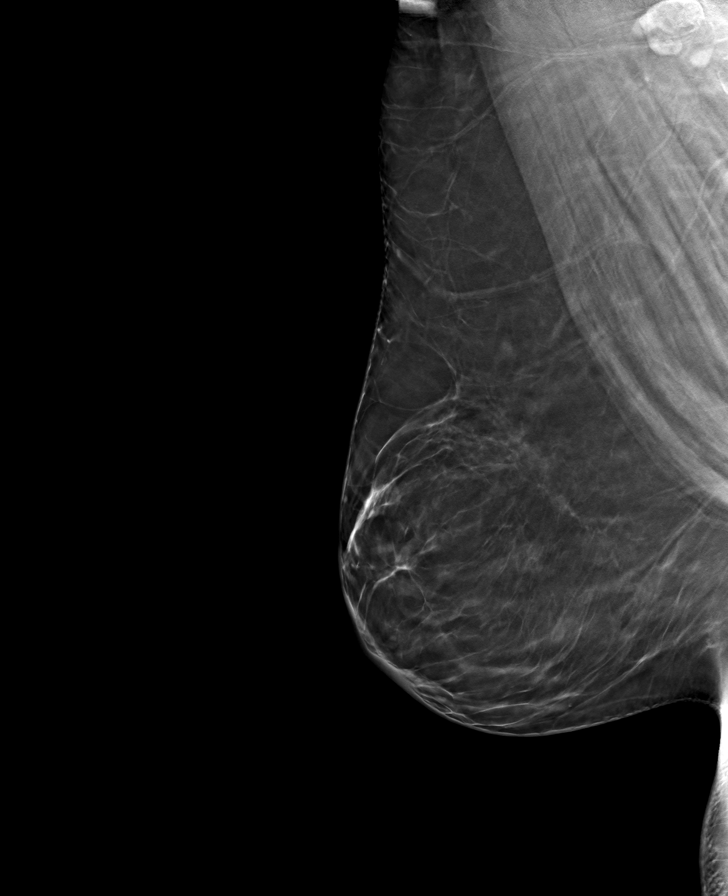

[R CC tomo · tomo slice 34/67.0]
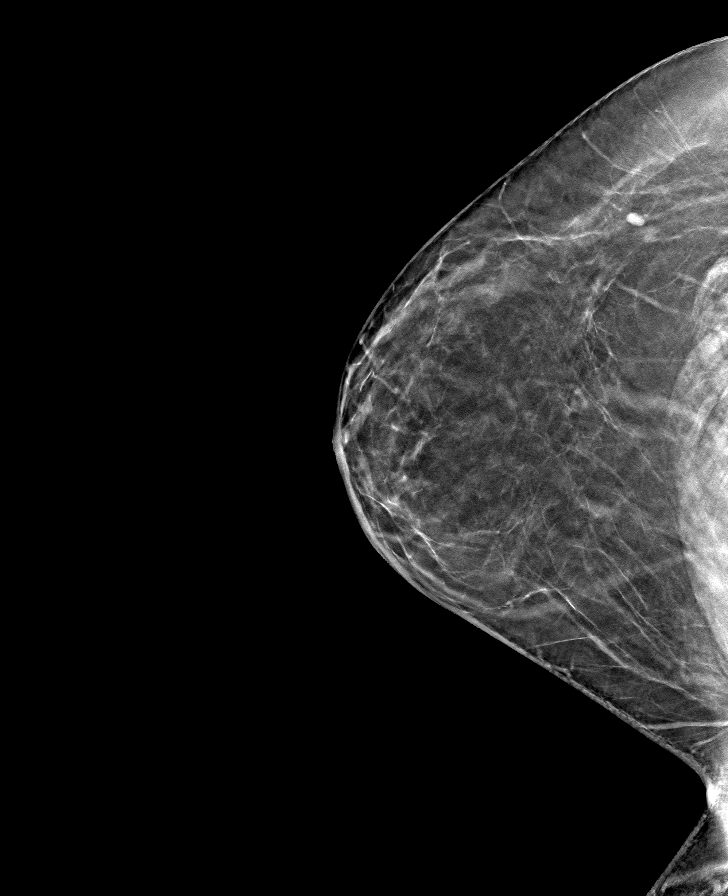

[8 of 24 positions shown; findings below may reference images not displayed]

ACR Breast Density Category b: There are scattered areas of
fibroglandular density.
FINDINGS: There are no findings suspicious for malignancy.
IMPRESSION: No mammographic evidence of malignancy. A result letter of this
screening mammogram will be mailed directly to the patient.

RECOMMENDATION:
Screening mammogram in one year. (Code:51-O-LD2)

BI-RADS CATEGORY  1: Negative.

## 2022-04-21 DIAGNOSIS — Z124 Encounter for screening for malignant neoplasm of cervix: Secondary | ICD-10-CM | POA: Diagnosis not present

## 2022-04-21 DIAGNOSIS — Z01419 Encounter for gynecological examination (general) (routine) without abnormal findings: Secondary | ICD-10-CM | POA: Diagnosis not present

## 2022-04-27 DIAGNOSIS — R102 Pelvic and perineal pain: Secondary | ICD-10-CM | POA: Diagnosis not present

## 2022-05-03 DIAGNOSIS — R200 Anesthesia of skin: Secondary | ICD-10-CM | POA: Diagnosis not present

## 2022-05-03 DIAGNOSIS — Z131 Encounter for screening for diabetes mellitus: Secondary | ICD-10-CM | POA: Diagnosis not present

## 2022-05-11 ENCOUNTER — Telehealth: Payer: Self-pay | Admitting: *Deleted

## 2022-05-11 NOTE — Telephone Encounter (Signed)
Patient is calling because she is still having a tingling crawling sensation in foot, returned call to have her to schedule appointment for f/u.Please schedule.

## 2022-05-13 ENCOUNTER — Encounter: Payer: Self-pay | Admitting: Podiatry

## 2022-05-13 ENCOUNTER — Ambulatory Visit: Payer: BC Managed Care – PPO | Admitting: Podiatry

## 2022-05-13 DIAGNOSIS — M722 Plantar fascial fibromatosis: Secondary | ICD-10-CM | POA: Diagnosis not present

## 2022-05-13 DIAGNOSIS — G5792 Unspecified mononeuropathy of left lower limb: Secondary | ICD-10-CM | POA: Diagnosis not present

## 2022-05-13 MED ORDER — GABAPENTIN 100 MG PO CAPS: 100.0000 mg | ORAL_CAPSULE | Freq: Two times a day (BID) | ORAL | 3 refills | Status: AC

## 2022-05-13 MED ORDER — TRIAMCINOLONE ACETONIDE 40 MG/ML IJ SUSP
20.0000 mg | Freq: Once | INTRAMUSCULAR | Status: AC
Start: 2022-05-13 — End: 2022-05-13
  Administered 2022-05-13: 20 mg

## 2022-05-13 NOTE — Progress Notes (Signed)
She presents today for follow-up of her left foot.  States that Dr. Paulla Dolly provided her with an injection to her left heel she no longer has any heel pain but states immediately after that injection she started to develop crawling sensations across the dorsum of her foot that extends up to the distal one third of her left leg anteriorly.  She states that seems to be worsening and moving more proximally as time goes on.  She does relate a history of sacroiliac arthritis and L5-S1 bulging disc history of sciatica.  She goes on to say that she is having nerve conduction velocity exams and her hands and wrists for a diagnosis of carpal tunnel in the near future.  Objective: Pulses are palpable bilateral feet.  Neurologic sensorium is intact she does have a recent reproducible allodynic type symptomatology in the dorsal aspect of the foot this seems to be worsened by stroking the skin of the anterior lateral leg and the distal one third of the leg itself.  There is no swelling no signs of infection no cellulitis drainage or odor.  No open lesions or wounds.  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  Possibly associated with her back issues.  Plan: Discussed etiology pathology conservative versus surgical therapies at this point expressed to her that she should try alpha lipoic acid over-the-counter.  And also started her on gabapentin 100 mg twice daily starting at night for the first week increasing to twice daily after that.  Also recommended that we may need to consider nerve conduction velocity exam if all of this fails to render her asymptomatic.

## 2022-05-24 ENCOUNTER — Encounter: Payer: Self-pay | Admitting: Physical Medicine & Rehabilitation

## 2022-06-17 ENCOUNTER — Encounter
Payer: BC Managed Care – PPO | Attending: Physical Medicine & Rehabilitation | Admitting: Physical Medicine & Rehabilitation

## 2022-06-17 ENCOUNTER — Encounter: Payer: Self-pay | Admitting: Physical Medicine & Rehabilitation

## 2022-06-17 VITALS — BP 129/85 | HR 83 | Ht 65.0 in | Wt 179.0 lb

## 2022-06-17 DIAGNOSIS — R200 Anesthesia of skin: Secondary | ICD-10-CM

## 2022-06-17 NOTE — Progress Notes (Signed)
EMG/NCV performed today .  Please find under media tab.  Briefly , EMG/NCV today demonstrated evidence of bilateral median neuropathy at the wrist affecting both sensory and motor components.  Findings consistent with referring diagnosis of r/o CTS.  Pt will f/u with PCP to discuss results and treatment options

## 2022-06-23 DIAGNOSIS — Z0000 Encounter for general adult medical examination without abnormal findings: Secondary | ICD-10-CM | POA: Diagnosis not present

## 2022-06-24 ENCOUNTER — Ambulatory Visit: Payer: BC Managed Care – PPO | Admitting: Podiatry

## 2022-07-15 DIAGNOSIS — M79642 Pain in left hand: Secondary | ICD-10-CM | POA: Diagnosis not present

## 2022-07-15 DIAGNOSIS — G5603 Carpal tunnel syndrome, bilateral upper limbs: Secondary | ICD-10-CM | POA: Diagnosis not present

## 2022-08-16 DIAGNOSIS — G5603 Carpal tunnel syndrome, bilateral upper limbs: Secondary | ICD-10-CM | POA: Diagnosis not present

## 2022-08-16 DIAGNOSIS — M65311 Trigger thumb, right thumb: Secondary | ICD-10-CM | POA: Diagnosis not present

## 2022-09-22 DIAGNOSIS — M545 Low back pain, unspecified: Secondary | ICD-10-CM | POA: Diagnosis not present

## 2022-11-02 ENCOUNTER — Ambulatory Visit: Payer: BC Managed Care – PPO | Admitting: Podiatry

## 2022-11-02 ENCOUNTER — Encounter: Payer: Self-pay | Admitting: Podiatry

## 2022-11-02 DIAGNOSIS — M722 Plantar fascial fibromatosis: Secondary | ICD-10-CM

## 2022-11-02 DIAGNOSIS — G5792 Unspecified mononeuropathy of left lower limb: Secondary | ICD-10-CM

## 2022-11-02 MED ORDER — TRIAMCINOLONE ACETONIDE 40 MG/ML IJ SUSP
20.0000 mg | Freq: Once | INTRAMUSCULAR | Status: AC
  Administered 2022-11-02: 20 mg

## 2022-11-02 MED ORDER — GABAPENTIN 300 MG PO CAPS: 300.0000 mg | ORAL_CAPSULE | Freq: Two times a day (BID) | ORAL | 3 refills | Status: AC

## 2022-11-02 NOTE — Progress Notes (Signed)
She presents today for follow-up of her neuritis dorsal aspect of her left foot.  States that the burning in all is still there and the skin is real sensitive to the touch as she refers to the dorsal lateral aspect overlying the fourth fifth tarsometatarsal joint.  States that the gabapentin I gave her last time may have helped some but not much.  I got the idea that she really was not taking it.  Objective: Vital signs are stable she is alert and oriented x 3.  Pulses are palpable.  She has allodynia type symptomatology overlying the dorsal lateral aspect of the left foot that we will start with strumming of the intermediate dorsal cutaneous nerve at the level of the ankle.  She also has tenderness on frontal plane range of motion at the fourth fifth TMT joint.  She has significant pain on palpation medial calcaneal tubercle left foot.  Assessment: Planter fasciitis with lateral compensatory syndrome and mononeuropathy possibly associated with compression of that nerve.  Plan: At this point we will increase her gabapentin 300 mg.  She will take 300 mg at bedtime and try taking 1 in the morning starting a couple of weeks later.  I also reinjected the left heel today 20 mg Kenalog 5 mg Marcaine point of maximal tenderness.  Tolerated procedure well without complications.  I would like to follow-up with her in about 4 to 6 weeks to make sure she is doing better.

## 2022-11-26 DIAGNOSIS — G5603 Carpal tunnel syndrome, bilateral upper limbs: Secondary | ICD-10-CM | POA: Diagnosis not present

## 2022-11-26 DIAGNOSIS — M65311 Trigger thumb, right thumb: Secondary | ICD-10-CM | POA: Diagnosis not present

## 2022-11-30 ENCOUNTER — Ambulatory Visit: Payer: BC Managed Care – PPO | Admitting: Podiatry

## 2023-01-06 ENCOUNTER — Other Ambulatory Visit: Payer: Self-pay | Admitting: Family Medicine

## 2023-01-06 DIAGNOSIS — Z1231 Encounter for screening mammogram for malignant neoplasm of breast: Secondary | ICD-10-CM

## 2023-01-07 ENCOUNTER — Ambulatory Visit
Admission: RE | Admit: 2023-01-07 | Discharge: 2023-01-07 | Disposition: A | Discharge status: Home / Self Care | Payer: BC Managed Care – PPO | Source: Ambulatory Visit | Attending: Family Medicine | Admitting: Family Medicine

## 2023-01-07 DIAGNOSIS — Z1231 Encounter for screening mammogram for malignant neoplasm of breast: Secondary | ICD-10-CM | POA: Diagnosis not present

## 2023-02-28 DIAGNOSIS — M79675 Pain in left toe(s): Secondary | ICD-10-CM | POA: Diagnosis not present

## 2023-12-08 ENCOUNTER — Other Ambulatory Visit: Payer: Self-pay | Admitting: Family Medicine

## 2023-12-08 DIAGNOSIS — Z1231 Encounter for screening mammogram for malignant neoplasm of breast: Secondary | ICD-10-CM

## 2023-12-22 ENCOUNTER — Ambulatory Visit: Admitting: Podiatry

## 2023-12-22 ENCOUNTER — Encounter: Payer: Self-pay | Admitting: Podiatry

## 2023-12-22 DIAGNOSIS — M19071 Primary osteoarthritis, right ankle and foot: Secondary | ICD-10-CM | POA: Diagnosis not present

## 2023-12-22 DIAGNOSIS — M722 Plantar fascial fibromatosis: Secondary | ICD-10-CM

## 2023-12-22 MED ORDER — TRIAMCINOLONE ACETONIDE 40 MG/ML IJ SUSP
40.0000 mg | Freq: Once | INTRAMUSCULAR | Status: AC
Start: 2023-12-22 — End: 2023-12-22
  Administered 2023-12-22: 40 mg

## 2023-12-23 NOTE — Progress Notes (Signed)
 She presents today chief complaint of pain in the top of the right foot times the past 2 weeks she states that she would like to have an injection if possible she denies any trauma to the foot.  Objective: Vital signs are stable alert and oriented x 3.  Pulses are palpable.  She has pain on palpation with attempted range of motion particular frontal plane at the level of the tarsometatarsal joints she also has pain on palpation at the plantar fascial insertion site right foot as well.  No Achilles pain.  No lateral pain.  Assessment: Plantar fasciitis right.  Significant osteoarthritis right foot tarsometatarsal joints.  Plan: I injected both spots today injected at the plantar fascia today 20 mg Kenalog  promoters to marked cane.  I injected subcutaneously today along the joint line and tarsometatarsal joints with 10 mg Kenalog  5 mg of Marcaine.  She tolerated the procedure well and I will follow-up with her on an as-needed basis.

## 2023-12-27 ENCOUNTER — Ambulatory Visit: Admitting: Podiatry

## 2024-01-09 ENCOUNTER — Ambulatory Visit
Admission: RE | Admit: 2024-01-09 | Discharge: 2024-01-09 | Disposition: A | Discharge status: Home / Self Care | Source: Ambulatory Visit | Attending: Family Medicine | Admitting: Family Medicine

## 2024-01-09 DIAGNOSIS — Z1231 Encounter for screening mammogram for malignant neoplasm of breast: Secondary | ICD-10-CM
